# Patient Record
Sex: Female | Born: 1975 | Race: White | Hispanic: No | Marital: Married | Smoking: Former smoker
Health system: Southern US, Community
[De-identification: ages and names within clinical notes are randomized; demographics above are authoritative.]

## PROBLEM LIST (undated history)

## (undated) DIAGNOSIS — I10 Essential (primary) hypertension: Secondary | ICD-10-CM

## (undated) DIAGNOSIS — Z6791 Unspecified blood type, Rh negative: Secondary | ICD-10-CM

## (undated) DIAGNOSIS — O26899 Other specified pregnancy related conditions, unspecified trimester: Secondary | ICD-10-CM

## (undated) DIAGNOSIS — E119 Type 2 diabetes mellitus without complications: Secondary | ICD-10-CM

## (undated) HISTORY — DX: Other specified pregnancy related conditions, unspecified trimester: O26.899

## (undated) HISTORY — PX: CHOLECYSTECTOMY: SHX55

## (undated) HISTORY — DX: Unspecified blood type, rh negative: Z67.91

## (undated) HISTORY — DX: Type 2 diabetes mellitus without complications: E11.9

---
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
## 2012-05-31 ENCOUNTER — Encounter (HOSPITAL_COMMUNITY): Payer: Self-pay | Admitting: *Deleted

## 2012-05-31 ENCOUNTER — Emergency Department (HOSPITAL_COMMUNITY)
Admission: EM | Admit: 2012-05-31 | Discharge: 2012-05-31 | Disposition: A | Discharge status: Home / Self Care | Payer: No Typology Code available for payment source | Attending: Emergency Medicine | Admitting: Emergency Medicine

## 2012-05-31 DIAGNOSIS — Z041 Encounter for examination and observation following transport accident: Secondary | ICD-10-CM

## 2012-05-31 DIAGNOSIS — Z043 Encounter for examination and observation following other accident: Secondary | ICD-10-CM | POA: Insufficient documentation

## 2012-05-31 MED ORDER — IBUPROFEN 800 MG PO TABS
800.0000 mg | ORAL_TABLET | Freq: Once | ORAL | Status: AC
  Administered 2012-05-31: 800 mg via ORAL
  Filled 2012-05-31: qty 1
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
## 2012-05-31 NOTE — ED Notes (Signed)
Patient not in triage or triage waiting.  Patient called multiple times.

## 2012-05-31 NOTE — ED Provider Notes (Signed)
Medical screening examination/treatment/procedure(s) were performed by non-physician practitioner and as supervising physician I was immediately available for consultation/collaboration.   Loren Racer, MD 05/31/12 [phone]
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
## 2017-12-13 ENCOUNTER — Inpatient Hospital Stay: Admit: 2017-12-13 | Payer: Self-pay

## 2019-04-10 ENCOUNTER — Emergency Department (HOSPITAL_COMMUNITY)
Admission: EM | Admit: 2019-04-10 | Discharge: 2019-04-10 | Disposition: A | Discharge status: Home / Self Care | Payer: Medicaid Other | Attending: Emergency Medicine | Admitting: Emergency Medicine

## 2019-04-10 ENCOUNTER — Encounter (HOSPITAL_COMMUNITY): Payer: Self-pay | Admitting: *Deleted

## 2019-04-10 ENCOUNTER — Emergency Department (HOSPITAL_COMMUNITY): Payer: Medicaid Other

## 2019-04-10 ENCOUNTER — Other Ambulatory Visit: Payer: Self-pay

## 2019-04-10 DIAGNOSIS — R42 Dizziness and giddiness: Secondary | ICD-10-CM | POA: Diagnosis not present

## 2019-04-10 DIAGNOSIS — R079 Chest pain, unspecified: Secondary | ICD-10-CM | POA: Diagnosis not present

## 2019-04-10 DIAGNOSIS — F17210 Nicotine dependence, cigarettes, uncomplicated: Secondary | ICD-10-CM | POA: Insufficient documentation

## 2019-04-10 DIAGNOSIS — R531 Weakness: Secondary | ICD-10-CM | POA: Insufficient documentation

## 2019-04-10 DIAGNOSIS — R51 Headache: Secondary | ICD-10-CM | POA: Diagnosis not present

## 2019-04-10 LAB — CBC WITH DIFFERENTIAL/PLATELET
Abs Immature Granulocytes: 0.03 10*3/uL (ref 0.00–0.07)
Basophils Absolute: 0.1 10*3/uL (ref 0.0–0.1)
Basophils Relative: 1 %
Eosinophils Absolute: 0.1 10*3/uL (ref 0.0–0.5)
Eosinophils Relative: 1 %
HCT: 41.1 % (ref 36.0–46.0)
Hemoglobin: 13.3 g/dL (ref 12.0–15.0)
Immature Granulocytes: 0 %
Lymphocytes Relative: 39 %
Lymphs Abs: 4 10*3/uL (ref 0.7–4.0)
MCH: 28.6 pg (ref 26.0–34.0)
MCHC: 32.4 g/dL (ref 30.0–36.0)
MCV: 88.4 fL (ref 80.0–100.0)
Monocytes Absolute: 0.7 10*3/uL (ref 0.1–1.0)
Monocytes Relative: 7 %
Neutro Abs: 5.6 10*3/uL (ref 1.7–7.7)
Neutrophils Relative %: 52 %
Platelets: 248 10*3/uL (ref 150–400)
RBC: 4.65 MIL/uL (ref 3.87–5.11)
RDW: 14.1 % (ref 11.5–15.5)
WBC: 10.5 10*3/uL (ref 4.0–10.5)
nRBC: 0 % (ref 0.0–0.2)

## 2019-04-10 LAB — COMPREHENSIVE METABOLIC PANEL
ALT: 23 U/L (ref 0–44)
AST: 22 U/L (ref 15–41)
Albumin: 3.8 g/dL (ref 3.5–5.0)
Alkaline Phosphatase: 82 U/L (ref 38–126)
Anion gap: 9 (ref 5–15)
BUN: 15 mg/dL (ref 6–20)
CO2: 20 mmol/L — ABNORMAL LOW (ref 22–32)
Calcium: 8.9 mg/dL (ref 8.9–10.3)
Chloride: 107 mmol/L (ref 98–111)
Creatinine, Ser: 0.8 mg/dL (ref 0.44–1.00)
GFR calc Af Amer: >60 mL/min (ref >60)
GFR calc non Af Amer: >60 mL/min (ref >60)
Glucose, Bld: 91 mg/dL (ref 70–99)
Potassium: 3.8 mmol/L (ref 3.5–5.1)
Sodium: 136 mmol/L (ref 135–145)
Total Bilirubin: 0.4 mg/dL (ref 0.3–1.2)
Total Protein: 7.7 g/dL (ref 6.5–8.1)

## 2019-04-10 LAB — URINALYSIS, ROUTINE W REFLEX MICROSCOPIC
Bilirubin Urine: NEGATIVE
Glucose, UA: NEGATIVE mg/dL
Ketones, ur: NEGATIVE mg/dL
Leukocytes,Ua: NEGATIVE
Nitrite: NEGATIVE
Protein, ur: NEGATIVE mg/dL
Specific Gravity, Urine: 1.02 (ref 1.005–1.030)
pH: 5 (ref 5.0–8.0)

## 2019-04-10 LAB — POC URINE PREG, ED: Preg Test, Ur: NEGATIVE

## 2019-04-10 LAB — RAPID URINE DRUG SCREEN, HOSP PERFORMED
Amphetamines: NOT DETECTED
Barbiturates: NOT DETECTED
Benzodiazepines: NOT DETECTED
Cocaine: NOT DETECTED
Opiates: NOT DETECTED
Tetrahydrocannabinol: POSITIVE — AB

## 2019-04-10 LAB — ETHANOL: Alcohol, Ethyl (B): <10 mg/dL (ref <10)

## 2019-04-10 LAB — TROPONIN I: Troponin I: <0.03 ng/mL (ref <0.03)

## 2019-04-10 LAB — PROTIME-INR
INR: 1 (ref 0.8–1.2)
Prothrombin Time: 13.3 seconds (ref 11.4–15.2)

## 2019-04-10 LAB — APTT: aPTT: 24 seconds (ref 24–36)

## 2019-04-10 NOTE — ED Triage Notes (Signed)
Patient reports dizziness for one day, worsening today with numbness to "entire body" for 30 minutes, family reporting slurred speech.

## 2019-04-10 NOTE — ED Notes (Signed)
Pt eating [employer] pizza

## 2019-04-10 NOTE — ED Provider Notes (Signed)
[facility]GENCY DEPARTMENT Provider Note   CSN: [id number] Arrival date & time: 04/10/19  1219    History   Chief Complaint Chief Complaint  Patient presents with  . Dizziness    HPI Melissa BeachShenequa Ramsey is a 43 y.o. female with no significant past medical history presenting with a 24 hour history of intermittent dizziness.  She describes a room spinning sensation lasting 30 minutes or less, both occurring yesterday morning than again this morning while standing outside smoking a cigarette.  This mornings episode was more severe with description of lasting longer, accompanied by vision loss transiently and inability to move.  She describes being unable to move, she felt "frozen in place" and her son had to help her sit from a standing position. Once better, she went in to take a bath at which time her husband noted her speech was slurred which she states lasted several minutes.  She reports chronic constant headaches which she blames on dehydration (states she never drinks water, only drinks soda's).  Her headache is worse today however, posterior in location.  In addition to the slurred speech, she was also tearful/crying while in the bath, unsure why but noted she only had tears coming from her right eye. She currently denies any of the above symptoms, but feels weak and fatigued, can barely lift her arms off the bed and feels like there is a heavy blanket on her.  Of note, she ambulated into the department.   Secondly, she endorses chest pain which she has often, current pain present for the past several days, left sided, sharp and constant. She denies sob, pain has no triggers, reports has had this sx intermittently for years, never been evaluated, states she just blames it on being "fat".  She has had no treatment prior to arrival.      The history is provided by the patient.    No past medical history on file.  There are no active problems to display for this patient.   Past  Surgical History:  Procedure Laterality Date  . CHOLECYSTECTOMY       OB History   No obstetric history on file.      Home Medications    Prior to Admission medications   Not on File    Family History No family history on file.  Social History Social History   Tobacco Use  . Smoking status: Current Every Day Smoker  Substance Use Topics  . Alcohol use: Not Currently  . Drug use: Not Currently     Allergies   Patient has no known allergies.   Review of Systems Review of Systems  Constitutional: Negative for chills and fever.  HENT: Negative for congestion and sore throat.   Eyes: Positive for visual disturbance.  Respiratory: Negative for chest tightness and shortness of breath.   Cardiovascular: Positive for chest pain.  Gastrointestinal: Negative for abdominal pain, nausea and vomiting.  Genitourinary: Negative.   Musculoskeletal: Negative for arthralgias, joint swelling, neck pain and neck stiffness.  Skin: Negative.  Negative for rash and wound.  Neurological: Positive for dizziness, speech difficulty, weakness and headaches. Negative for light-headedness and numbness.  Psychiatric/Behavioral: Negative.   All other systems reviewed and are negative.    Physical Exam Updated Vital Signs BP 108/84   Pulse 82   Resp 20   Ht 4\' 11"  (1.499 m)   Wt 72.6 kg   LMP 04/04/2019   SpO2 99%   BMI 32.32 kg/m   Physical Exam  Vitals signs and nursing note reviewed.  Constitutional:      General: She is not in acute distress.    Appearance: She is well-developed.  HENT:     Head: Normocephalic and atraumatic.     Mouth/Throat:     Mouth: Mucous membranes are moist.  Eyes:     General: No visual field deficit.    Extraocular Movements: Extraocular movements intact.     Conjunctiva/sclera: Conjunctivae normal.     Pupils: Pupils are equal, round, and reactive to light.  Neck:     Musculoskeletal: Normal range of motion. No neck rigidity.   Cardiovascular:     Rate and Rhythm: Normal rate and regular rhythm.     Heart sounds: Normal heart sounds.  Pulmonary:     Effort: Pulmonary effort is normal.     Breath sounds: Normal breath sounds. No wheezing.  Abdominal:     General: Bowel sounds are normal.     Palpations: Abdomen is soft.     Tenderness: There is no abdominal tenderness.  Musculoskeletal: Normal range of motion.        General: No tenderness or deformity.  Lymphadenopathy:     Cervical: No cervical adenopathy.  Skin:    General: Skin is warm and dry.     Capillary Refill: Capillary refill takes less than 2 seconds.  Neurological:     General: No focal deficit present.     Mental Status: She is alert and oriented to person, place, and time.     Cranial Nerves: Cranial nerves are intact. No cranial nerve deficit, dysarthria or facial asymmetry.     Motor: Weakness present. No tremor or pronator drift.     Coordination: Finger-Nose-Finger Test and Heel to [place] Test normal.     Comments: Pt moves all 4 extremities but poor effort.  Equal grip strength.        ED Treatments / Results  Labs (all labs ordered are listed, but only abnormal results are displayed) Labs Reviewed  COMPREHENSIVE METABOLIC PANEL - Abnormal; Notable for the following components:      Result Value   CO2 20 (*)    All other components within normal limits  RAPID URINE DRUG SCREEN, HOSP PERFORMED - Abnormal; Notable for the following components:   Tetrahydrocannabinol POSITIVE (*)    All other components within normal limits  URINALYSIS, ROUTINE W REFLEX MICROSCOPIC - Abnormal; Notable for the following components:   APPearance HAZY (*)    Hgb urine dipstick SMALL (*)    Bacteria, UA RARE (*)    All other components within normal limits  ETHANOL  PROTIME-INR  APTT  CBC WITH DIFFERENTIAL/PLATELET  TROPONIN I  POC URINE PREG, ED    EKG EKG Interpretation  Date/Time:  Monday April 10 2019 12:41:49 EDT Ventricular Rate:  93  PR Interval:    QRS Duration: 90 QT Interval:  369 QTC Calculation: 459 R Axis:   30 Text Interpretation:  Sinus rhythm Baseline wander in lead(s) II III aVR aVL aVF No old tracing to compare Confirmed by Jacalyn Lefevre [phone]) on 04/10/2019 1:16:42 PM   Radiology Ct Head Wo Contrast  Result Date: 04/10/2019 CLINICAL DATA:  Dizziness and headache. EXAM: CT HEAD WITHOUT CONTRAST TECHNIQUE: Contiguous axial images were obtained from the base of the skull through the vertex without intravenous contrast. COMPARISON:  None. FINDINGS: Brain: No evidence of acute infarction, hemorrhage, hydrocephalus, extra-axial collection or mass lesion/mass effect. Vascular: No hyperdense vessel or unexpected calcification. Skull: Normal. Negative  for fracture or focal lesion. Sinuses/Orbits: No acute finding. Other: None. IMPRESSION: Normal head CT. Electronically Signed   By: Irish Lack M.D.   On: 04/10/2019 14:45   Dg Chest Portable 1 View  Result Date: 04/10/2019 CLINICAL DATA:  Dizziness.  Chest pain.  Smoking history. EXAM: PORTABLE CHEST 1 VIEW COMPARISON:  10/08/2018 FINDINGS: The heart size and mediastinal contours are within normal limits. Both lungs are clear. The visualized skeletal structures are unremarkable. IMPRESSION: No active disease. Electronically Signed   By: Paulina Fusi M.D.   On: 04/10/2019 13:46    Procedures Procedures (including critical care time)  Medications Ordered in ED Medications - No data to display   Initial Impression / Assessment and Plan / ED Course  I have reviewed the triage vital signs and the nursing notes.  Pertinent labs & imaging results that were available during my care of the patient were reviewed by me and considered in my medical decision making (see chart for details).        Labs reviewed and discussed with pt with normal findings except positive for [facility] which pt denies.  She was ambulatory in the dept. In fact was walking down the hallway to  get the pizza she had ordered for delivery just prior to dc home.  Advised to avoid smoking marijuana.  Advised f/u with primary care, referrals given.   Final Clinical Impressions(s) / ED Diagnoses   Final diagnoses:  Lightheadedness  Weakness    ED Discharge Orders    None       Victoriano Lain 04/10/19 1508    Jacalyn Lefevre, MD 04/11/19 [phone]

## 2019-04-10 NOTE — Discharge Instructions (Addendum)
Your labs, ekg and imaging studies today are stable with no evidence of stroke or other reason for today's symptoms.  You have been smoking marijuana however as evidenced by your labs.  This can be the source of your symptoms.  See the options above for obtaining primary care. In the interim, return here for any worsened or new symptoms.

## 2019-05-29 ENCOUNTER — Telehealth: Payer: Self-pay | Admitting: *Deleted

## 2019-05-29 NOTE — Telephone Encounter (Signed)
Call can't be completed @ 4:21 pm and pt don't have MyChart. [place]

## 2019-05-30 ENCOUNTER — Encounter: Payer: Self-pay | Admitting: Adult Health

## 2019-05-30 ENCOUNTER — Other Ambulatory Visit: Payer: Self-pay

## 2019-05-30 ENCOUNTER — Ambulatory Visit (INDEPENDENT_AMBULATORY_CARE_PROVIDER_SITE_OTHER): Payer: Medicaid Other | Admitting: Adult Health

## 2019-05-30 VITALS — Ht 59.0 in | Wt 164.0 lb

## 2019-05-30 DIAGNOSIS — O09211 Supervision of pregnancy with history of pre-term labor, first trimester: Secondary | ICD-10-CM | POA: Diagnosis not present

## 2019-05-30 DIAGNOSIS — Z3A01 Less than 8 weeks gestation of pregnancy: Secondary | ICD-10-CM | POA: Diagnosis not present

## 2019-05-30 DIAGNOSIS — Z3201 Encounter for pregnancy test, result positive: Secondary | ICD-10-CM

## 2019-05-30 DIAGNOSIS — Z8632 Personal history of gestational diabetes: Secondary | ICD-10-CM

## 2019-05-30 DIAGNOSIS — O3680X0 Pregnancy with inconclusive fetal viability, not applicable or unspecified: Secondary | ICD-10-CM | POA: Diagnosis not present

## 2019-05-30 DIAGNOSIS — O09291 Supervision of pregnancy with other poor reproductive or obstetric history, first trimester: Secondary | ICD-10-CM | POA: Diagnosis not present

## 2019-05-30 DIAGNOSIS — O99331 Smoking (tobacco) complicating pregnancy, first trimester: Secondary | ICD-10-CM

## 2019-05-30 DIAGNOSIS — O09521 Supervision of elderly multigravida, first trimester: Secondary | ICD-10-CM | POA: Insufficient documentation

## 2019-05-30 DIAGNOSIS — F17200 Nicotine dependence, unspecified, uncomplicated: Secondary | ICD-10-CM | POA: Insufficient documentation

## 2019-05-30 DIAGNOSIS — O09299 Supervision of pregnancy with other poor reproductive or obstetric history, unspecified trimester: Secondary | ICD-10-CM | POA: Insufficient documentation

## 2019-05-30 DIAGNOSIS — O09891 Supervision of other high risk pregnancies, first trimester: Secondary | ICD-10-CM

## 2019-05-30 DIAGNOSIS — Z720 Tobacco use: Secondary | ICD-10-CM | POA: Insufficient documentation

## 2019-05-30 HISTORY — DX: Supervision of pregnancy with other poor reproductive or obstetric history, first trimester: O09.291

## 2019-05-30 MED ORDER — PROMETHAZINE HCL 25 MG PO TABS: 25.0000 mg | ORAL_TABLET | Freq: Four times a day (QID) | ORAL | 1 refills | Status: DC | PRN

## 2019-05-30 MED ORDER — PRENATAL PLUS 27-1 MG PO TABS: 1.0000 | ORAL_TABLET | Freq: Every day | ORAL | 12 refills | Status: DC

## 2019-05-30 NOTE — Progress Notes (Addendum)
Patient ID: Melissa BeachShenequa Mckiernan, female   DOB: 10/23/1976, 43 y.o.   MRN: [id number]   TELEHEALTH VIRTUAL GYNECOLOGY VISIT ENCOUNTER NOTE  I connected with Melissa Ramsey on 05/30/19 at 11:00 AM EDT by telephone at home and verified that I am speaking with the correct person using two identifiers.   I discussed the limitations, risks, security and privacy concerns of performing an evaluation and management service by telephone and the availability of in person appointments. I also discussed with the patient that there may be a patient responsible charge related to this service. The patient expressed understanding and agreed to proceed.   History:  Melissa Ramsey is a 43 y.o. [phone] [id number], white  Female,married, being evaluated today for missing a period and had 2+HPTs, about about 4+6 weeks by LMP with EDD 01/31/20, has had nausea and used CBD oil, told to stop. She had miscarriage in 2018 and has history of gestational diabetes, pre eclampsia, and preterm delivery and had  C-section for breech. And is Rh negative, she says.   She denies any abnormal vaginal discharge, bleeding, pelvic pain or other concerns.       Past Medical History:  Diagnosis Date  . Diabetes mellitus without complication (HCC)    gestational diabetes   . Hypertension    pre eclampsia    Past Surgical History:  Procedure Laterality Date  . CESAREAN SECTION    . CHOLECYSTECTOMY     The following portions of the patient's history were reviewed and updated as appropriate: allergies, current medications, past family history, past medical history, past social history, past surgical history and problem list.   [facility]  Review of Systems:  Pertinent items noted in HPI and remainder of comprehensive ROS otherwise negative.  Physical Exam:   General:  Alert, oriented and cooperative.   Mental Status: Normal mood and affect perceived. Normal judgment and thought content.  Physical exam deferred due  to nature of the encounter Ht 4\' 11"  (1.499 m)   Wt 164 lb (74.4 kg)   LMP 04/26/2019   BMI 33.12 kg/m per pt. Fall risk is low. PHQ 2 score 0. Decrease smoking, stop CBD oil, will Rx phenergan and eat often.  Labs and Imaging No results found for this or any previous visit (from the past 336 hour(s)). No results found.    Assessment and Plan:     1. Encounter to determine fetal viability of pregnancy, single or unspecified fetus Return in about 3 week for dating US  - US OB Comp Less 14 Wks; Future  2. Multigravida of advanced maternal age in first trimester   3. Less than [redacted] weeks gestation of pregnancy Eat often Meds ordered this encounter  Medications  . prenatal vitamin w/FE, FA (PRENATAL 1 + 1) 27-1 MG TABS tablet    Sig: Take 1 tablet by mouth daily at 12 noon.    Dispense:  30 each    Refill:  12    Order Specific Question:   Supervising Provider    Answer:   Despina HiddenEURE, LUTHER H [[id number]]  . promethazine (PHENERGAN) 25 MG tablet    Sig: Take 1 tablet (25 mg total) by mouth every 6 (six) hours as needed for nausea or vomiting.    Dispense:  30 tablet    Refill:  1    Order Specific Question:   Supervising Provider    Answer:   Despina HiddenEURE, LUTHER H [[id number]]    4. Positive pregnancy test 2+ HPTs  5. History of preterm delivery, currently pregnant in first trimester   6. History of gestational diabetes in prior pregnancy, currently pregnant  7. History of pre-eclampsia in prior pregnancy, currently pregnant in first trimester  8. Smoker -starting cutting down on cigarettes        I discussed the assessment and treatment plan with the patient. The patient was provided an opportunity to ask questions and all were answered. The patient agreed with the plan and demonstrated an understanding of the instructions.   The patient was advised to call back or seek an in-person evaluation/go to the ED if the symptoms worsen or if the condition fails to improve as anticipated.  I  provided 11 minutes of non-face-to-face time during this encounter.   Derrek Monaco, NP [facility], [place]

## 2019-06-14 ENCOUNTER — Telehealth: Payer: Self-pay | Admitting: *Deleted

## 2019-06-14 NOTE — Telephone Encounter (Signed)
Patient called stating she is having light bleeding after intercourse with her husband.  No cramping at this time.  Informed patient that bleeding after sex is common and should resolve on it's own.  If she develops heavier bleeding or cramping, to call our office or go to the hospital.  Advised no sex for 7 days until after the bleeding stops.  PT verbalized understanding.

## 2019-06-19 ENCOUNTER — Telehealth: Payer: Self-pay | Admitting: *Deleted

## 2019-06-19 NOTE — Telephone Encounter (Signed)
I called patient no answer left voicemail of covid restrictions and appt info

## 2019-06-20 ENCOUNTER — Other Ambulatory Visit: Payer: Self-pay

## 2019-06-20 ENCOUNTER — Ambulatory Visit (INDEPENDENT_AMBULATORY_CARE_PROVIDER_SITE_OTHER): Payer: Medicaid Other

## 2019-06-20 DIAGNOSIS — Z3A01 Less than 8 weeks gestation of pregnancy: Secondary | ICD-10-CM

## 2019-06-20 DIAGNOSIS — O3680X0 Pregnancy with inconclusive fetal viability, not applicable or unspecified: Secondary | ICD-10-CM

## 2019-06-20 NOTE — Progress Notes (Signed)
6+3 wks,single IUP w/ys,positive fht 86 bpm,normal ovaries bilat,crl 6.79 mm,pt will come back for f/u ultrasound to ck FHR per Anderson Malta

## 2019-07-11 ENCOUNTER — Telehealth: Payer: Self-pay | Admitting: Obstetrics and Gynecology

## 2019-07-11 ENCOUNTER — Other Ambulatory Visit: Payer: Self-pay | Admitting: Adult Health

## 2019-07-11 DIAGNOSIS — O3680X0 Pregnancy with inconclusive fetal viability, not applicable or unspecified: Secondary | ICD-10-CM

## 2019-07-11 NOTE — Telephone Encounter (Signed)

## 2019-07-12 ENCOUNTER — Other Ambulatory Visit: Payer: Self-pay | Admitting: Adult Health

## 2019-07-12 ENCOUNTER — Other Ambulatory Visit: Payer: Self-pay

## 2019-07-12 ENCOUNTER — Ambulatory Visit (INDEPENDENT_AMBULATORY_CARE_PROVIDER_SITE_OTHER): Payer: Medicaid Other | Admitting: Obstetrics and Gynecology

## 2019-07-12 ENCOUNTER — Ambulatory Visit (INDEPENDENT_AMBULATORY_CARE_PROVIDER_SITE_OTHER): Payer: Medicaid Other

## 2019-07-12 DIAGNOSIS — O3680X0 Pregnancy with inconclusive fetal viability, not applicable or unspecified: Secondary | ICD-10-CM | POA: Diagnosis not present

## 2019-07-12 DIAGNOSIS — Z3A01 Less than 8 weeks gestation of pregnancy: Secondary | ICD-10-CM

## 2019-07-12 DIAGNOSIS — Z3A09 9 weeks gestation of pregnancy: Secondary | ICD-10-CM | POA: Diagnosis not present

## 2019-07-12 DIAGNOSIS — O021 Missed abortion: Secondary | ICD-10-CM | POA: Diagnosis not present

## 2019-07-12 NOTE — Progress Notes (Signed)
F/U US 6+5 wks single IUP,no fht,no signification growth from prior ultrasound,GS 41.3 mm=9+4 wks,normal ovaries bilat,Dr Glo Herring review images with pt

## 2019-07-12 NOTE — Progress Notes (Signed)
   [facility] Visit  @DATE @            Patient name: Melissa Ramsey MRN [id number]  Date of birth: 1976-01-27  CC & HPI:  Melissa Ramsey is a 43 y.o. female presenting today for follow-up ultrasound for fetal viability.  She had ultrasound with a 7 mm fetal pole on 7 July that has not shown any fetal activity this is her first follow-up visit she is having no bleeding Today's ultrasound shows an 8.0 mm fetal pole with no fetal cardiac activity.  The maternal heart rate pulsations results in some rocking motion of the fetal pole but no fetal cardiac activity is present I explained to the patient the difference and shown her how large the baby should be and she understands the inevitability of pregnancy loss series.  She desires to wait until she begins to bleed to use the Cytotec pills to expect the miscarriage ROS:  ROS [id number] she had a miscarriage 2 years ago   Pertinent History Reviewed:   Reviewed: Significant for spontaneously expelled pregnancy 2 years ago Medical         Past Medical History:  Diagnosis Date  . Diabetes mellitus without complication (HCC)    gestational diabetes   . Hypertension    pre eclampsia   . Rh negative state in antepartum period                               Surgical Hx:    Past Surgical History:  Procedure Laterality Date  . CESAREAN SECTION    . CHOLECYSTECTOMY     Medications: Reviewed & Updated - see associated section                       Current Outpatient Medications:  .  prenatal vitamin w/FE, FA (PRENATAL 1 + 1) 27-1 MG TABS tablet, Take 1 tablet by mouth daily at 12 noon., Disp: 30 each, Rfl: 12 .  promethazine (PHENERGAN) 25 MG tablet, Take 1 tablet (25 mg total) by mouth every 6 (six) hours as needed for nausea or vomiting., Disp: 30 tablet, Rfl: 1   Social History: Reviewed -  reports that she has been smoking cigarettes. She has never used smokeless tobacco.  Objective Findings:  Vitals: Last menstrual period  04/26/2019.  PHYSICAL EXAMINATION General appearance - alert, well appearing, and in no distress and anxious Mental status - alert, oriented to person, place, and time, normal mood, behavior, speech, dress, motor activity, and thought processes Chest - clear to auscultation, no wheezes, rales or rhonchi, symmetric air entry Heart - normal rate and regular rhythm Abdomen - not examined Breasts -  Skin - normal coloration and turgor, no rashes, no suspicious skin lesions noted  PELVIC Not done, declined by patient after offered  Assessment & Plan:   A:  1. Inevitable AB  P:  1. Will send in prescription for Cytotec 800 mcg to be placed per vagina when she begins to have spotting.   2. Follow-up 3 weeks

## 2019-08-01 ENCOUNTER — Telehealth: Payer: Self-pay | Admitting: Obstetrics and Gynecology

## 2019-08-01 NOTE — Telephone Encounter (Signed)
Called patient and left message informing her that we are not allowing any visitors or children to come to visit with her at this time and we are requiring a mask to be worn during the visit. Asked if she has had any exposure to anyone suspected or confirmed of having COVID-19 or if she is experiencing any of the following: fever, cough, sob, muscle pain, severe headache, sore throat, diarrhea, loss of taste or smell or ear, nose or throat discomfort to call and reschedule.   °

## 2019-08-02 ENCOUNTER — Ambulatory Visit (INDEPENDENT_AMBULATORY_CARE_PROVIDER_SITE_OTHER): Payer: Medicaid Other | Admitting: Obstetrics and Gynecology

## 2019-08-02 ENCOUNTER — Encounter: Payer: Self-pay | Admitting: Obstetrics and Gynecology

## 2019-08-02 ENCOUNTER — Other Ambulatory Visit: Payer: Self-pay

## 2019-08-02 VITALS — BP 115/77 | HR 95 | Ht 59.0 in | Wt 164.0 lb

## 2019-08-02 DIAGNOSIS — O021 Missed abortion: Secondary | ICD-10-CM

## 2019-08-02 DIAGNOSIS — Z3A01 Less than 8 weeks gestation of pregnancy: Secondary | ICD-10-CM | POA: Diagnosis not present

## 2019-08-02 MED ORDER — MISOPROSTOL 200 MCG PO TABS: 800.0000 ug | ORAL_TABLET | Freq: Once | ORAL | 1 refills | Status: DC

## 2019-08-02 NOTE — Progress Notes (Signed)
Patient ID: Melissa Ramsey, female   DOB: 1976/10/13, 43 y.o.   MRN: [id number]    [facility] Visit  @DATE @            Patient name: Melissa Ramsey MRN [id number]  Date of birth: 1976/11/08  CC & HPI:  Melissa Ramsey is a 43 y.o. female presenting today for Missed AB pt . Started spotting and small clots when voiding this past Sunday 07/30/2019 hasn't taken cytotec. Wants to move foreward in expelling the miscarriage.and take cytotec  ROS:  ROS +vaginal spotting  Pertinent History Reviewed:   Reviewed:  Medical         Past Medical History:  Diagnosis Date  . Diabetes mellitus without complication (HCC)    gestational diabetes   . Hypertension    pre eclampsia   . Rh negative state in antepartum period                               Surgical Hx:    Past Surgical History:  Procedure Laterality Date  . CESAREAN SECTION    . CHOLECYSTECTOMY     Medications: Reviewed & Updated - see associated section                       Current Outpatient Medications:  .  prenatal vitamin w/FE, FA (PRENATAL 1 + 1) 27-1 MG TABS tablet, Take 1 tablet by mouth daily at 12 noon. (Patient not taking: Reported on 08/02/2019), Disp: 30 each, Rfl: 12 .  promethazine (PHENERGAN) 25 MG tablet, Take 1 tablet (25 mg total) by mouth every 6 (six) hours as needed for nausea or vomiting. (Patient not taking: Reported on 08/02/2019), Disp: 30 tablet, Rfl: 1   Social History: Reviewed -  reports that she has been smoking cigarettes. She has never used smokeless tobacco.  Objective Findings:  Vitals: Blood pressure 115/77, pulse 95, height 4\' 11"  (1.499 m), weight 164 lb (74.4 kg), last menstrual period 04/26/2019.  PHYSICAL EXAMINATION General appearance - alert, well appearing, and in no distress Mental status - alert, oriented to person, place, and time, normal mood, behavior, speech, dress, motor activity, and thought processes, affect appropriate to mood  PELVIC DEFERRED DISCUSSION  ONLY  Assessment & Plan:   A:  1.  Missed AB desires to use cytotec now.  P:  1.  Rx cytotec 2. F/u prn or in 2 weeks if positive pregnancy test    By signing my name below, I, Samul Dada, attest that this documentation has been prepared under the direction and in the presence of Jonnie Kind, MD. Electronically Signed: [place]. 08/02/19. 3:04 PM.  I personally performed the services described in this documentation, which was SCRIBED in my presence. The recorded information has been reviewed and considered accurate. It has been edited as necessary during review. Jonnie Kind, MD

## 2019-08-29 DIAGNOSIS — F4323 Adjustment disorder with mixed anxiety and depressed mood: Secondary | ICD-10-CM | POA: Diagnosis not present

## 2019-09-05 DIAGNOSIS — F4323 Adjustment disorder with mixed anxiety and depressed mood: Secondary | ICD-10-CM | POA: Diagnosis not present

## 2019-09-08 DIAGNOSIS — F4323 Adjustment disorder with mixed anxiety and depressed mood: Secondary | ICD-10-CM | POA: Diagnosis not present

## 2019-09-12 DIAGNOSIS — F4323 Adjustment disorder with mixed anxiety and depressed mood: Secondary | ICD-10-CM | POA: Diagnosis not present

## 2019-09-19 DIAGNOSIS — F4323 Adjustment disorder with mixed anxiety and depressed mood: Secondary | ICD-10-CM | POA: Diagnosis not present

## 2019-09-26 DIAGNOSIS — F4323 Adjustment disorder with mixed anxiety and depressed mood: Secondary | ICD-10-CM | POA: Diagnosis not present

## 2019-10-03 DIAGNOSIS — F4323 Adjustment disorder with mixed anxiety and depressed mood: Secondary | ICD-10-CM | POA: Diagnosis not present

## 2019-10-11 DIAGNOSIS — F4323 Adjustment disorder with mixed anxiety and depressed mood: Secondary | ICD-10-CM | POA: Diagnosis not present

## 2019-10-19 DIAGNOSIS — F4323 Adjustment disorder with mixed anxiety and depressed mood: Secondary | ICD-10-CM | POA: Diagnosis not present

## 2019-10-24 DIAGNOSIS — F4323 Adjustment disorder with mixed anxiety and depressed mood: Secondary | ICD-10-CM | POA: Diagnosis not present

## 2019-10-31 DIAGNOSIS — F4323 Adjustment disorder with mixed anxiety and depressed mood: Secondary | ICD-10-CM | POA: Diagnosis not present

## 2019-11-07 DIAGNOSIS — F4323 Adjustment disorder with mixed anxiety and depressed mood: Secondary | ICD-10-CM | POA: Diagnosis not present

## 2019-11-14 DIAGNOSIS — F4323 Adjustment disorder with mixed anxiety and depressed mood: Secondary | ICD-10-CM | POA: Diagnosis not present

## 2019-11-21 DIAGNOSIS — F4323 Adjustment disorder with mixed anxiety and depressed mood: Secondary | ICD-10-CM | POA: Diagnosis not present

## 2019-11-28 DIAGNOSIS — F4323 Adjustment disorder with mixed anxiety and depressed mood: Secondary | ICD-10-CM | POA: Diagnosis not present

## 2019-12-11 DIAGNOSIS — F4323 Adjustment disorder with mixed anxiety and depressed mood: Secondary | ICD-10-CM | POA: Diagnosis not present

## 2019-12-19 DIAGNOSIS — F4323 Adjustment disorder with mixed anxiety and depressed mood: Secondary | ICD-10-CM | POA: Diagnosis not present

## 2019-12-21 DIAGNOSIS — F4323 Adjustment disorder with mixed anxiety and depressed mood: Secondary | ICD-10-CM | POA: Diagnosis not present

## 2019-12-25 DIAGNOSIS — F4323 Adjustment disorder with mixed anxiety and depressed mood: Secondary | ICD-10-CM | POA: Diagnosis not present

## 2020-01-02 DIAGNOSIS — F4323 Adjustment disorder with mixed anxiety and depressed mood: Secondary | ICD-10-CM | POA: Diagnosis not present

## 2020-01-08 DIAGNOSIS — F4323 Adjustment disorder with mixed anxiety and depressed mood: Secondary | ICD-10-CM | POA: Diagnosis not present

## 2020-01-15 DIAGNOSIS — F4323 Adjustment disorder with mixed anxiety and depressed mood: Secondary | ICD-10-CM | POA: Diagnosis not present

## 2020-01-22 DIAGNOSIS — F4323 Adjustment disorder with mixed anxiety and depressed mood: Secondary | ICD-10-CM | POA: Diagnosis not present

## 2020-01-29 DIAGNOSIS — F4323 Adjustment disorder with mixed anxiety and depressed mood: Secondary | ICD-10-CM | POA: Diagnosis not present

## 2020-02-05 DIAGNOSIS — F4323 Adjustment disorder with mixed anxiety and depressed mood: Secondary | ICD-10-CM | POA: Diagnosis not present

## 2020-02-12 DIAGNOSIS — F4323 Adjustment disorder with mixed anxiety and depressed mood: Secondary | ICD-10-CM | POA: Diagnosis not present

## 2020-02-19 DIAGNOSIS — F4323 Adjustment disorder with mixed anxiety and depressed mood: Secondary | ICD-10-CM | POA: Diagnosis not present

## 2020-02-29 DIAGNOSIS — F4323 Adjustment disorder with mixed anxiety and depressed mood: Secondary | ICD-10-CM | POA: Diagnosis not present

## 2020-03-04 DIAGNOSIS — F4323 Adjustment disorder with mixed anxiety and depressed mood: Secondary | ICD-10-CM | POA: Diagnosis not present

## 2020-03-11 DIAGNOSIS — F4323 Adjustment disorder with mixed anxiety and depressed mood: Secondary | ICD-10-CM | POA: Diagnosis not present

## 2020-03-18 DIAGNOSIS — F4323 Adjustment disorder with mixed anxiety and depressed mood: Secondary | ICD-10-CM | POA: Diagnosis not present

## 2020-03-25 DIAGNOSIS — F4323 Adjustment disorder with mixed anxiety and depressed mood: Secondary | ICD-10-CM | POA: Diagnosis not present

## 2020-04-01 DIAGNOSIS — F4323 Adjustment disorder with mixed anxiety and depressed mood: Secondary | ICD-10-CM | POA: Diagnosis not present

## 2020-04-08 DIAGNOSIS — F4323 Adjustment disorder with mixed anxiety and depressed mood: Secondary | ICD-10-CM | POA: Diagnosis not present

## 2020-04-15 DIAGNOSIS — F4323 Adjustment disorder with mixed anxiety and depressed mood: Secondary | ICD-10-CM | POA: Diagnosis not present

## 2020-04-22 DIAGNOSIS — F4323 Adjustment disorder with mixed anxiety and depressed mood: Secondary | ICD-10-CM | POA: Diagnosis not present

## 2020-05-06 DIAGNOSIS — F4323 Adjustment disorder with mixed anxiety and depressed mood: Secondary | ICD-10-CM | POA: Diagnosis not present

## 2020-05-15 DIAGNOSIS — F4323 Adjustment disorder with mixed anxiety and depressed mood: Secondary | ICD-10-CM | POA: Diagnosis not present

## 2020-05-20 DIAGNOSIS — F4323 Adjustment disorder with mixed anxiety and depressed mood: Secondary | ICD-10-CM | POA: Diagnosis not present

## 2020-05-27 DIAGNOSIS — F4323 Adjustment disorder with mixed anxiety and depressed mood: Secondary | ICD-10-CM | POA: Diagnosis not present

## 2020-06-18 DIAGNOSIS — F4323 Adjustment disorder with mixed anxiety and depressed mood: Secondary | ICD-10-CM | POA: Diagnosis not present

## 2020-06-24 DIAGNOSIS — F4323 Adjustment disorder with mixed anxiety and depressed mood: Secondary | ICD-10-CM | POA: Diagnosis not present

## 2020-07-01 DIAGNOSIS — F4323 Adjustment disorder with mixed anxiety and depressed mood: Secondary | ICD-10-CM | POA: Diagnosis not present

## 2020-07-15 DIAGNOSIS — F4323 Adjustment disorder with mixed anxiety and depressed mood: Secondary | ICD-10-CM | POA: Diagnosis not present

## 2020-07-22 DIAGNOSIS — F4323 Adjustment disorder with mixed anxiety and depressed mood: Secondary | ICD-10-CM | POA: Diagnosis not present

## 2020-07-30 DIAGNOSIS — F4323 Adjustment disorder with mixed anxiety and depressed mood: Secondary | ICD-10-CM | POA: Diagnosis not present

## 2020-08-05 DIAGNOSIS — F4323 Adjustment disorder with mixed anxiety and depressed mood: Secondary | ICD-10-CM | POA: Diagnosis not present

## 2020-08-12 DIAGNOSIS — F4323 Adjustment disorder with mixed anxiety and depressed mood: Secondary | ICD-10-CM | POA: Diagnosis not present

## 2020-08-20 DIAGNOSIS — F4323 Adjustment disorder with mixed anxiety and depressed mood: Secondary | ICD-10-CM | POA: Diagnosis not present

## 2020-09-02 DIAGNOSIS — F4323 Adjustment disorder with mixed anxiety and depressed mood: Secondary | ICD-10-CM | POA: Diagnosis not present

## 2020-09-08 ENCOUNTER — Emergency Department (HOSPITAL_COMMUNITY)
Admission: EM | Admit: 2020-09-08 | Discharge: 2020-09-08 | Disposition: A | Discharge status: Home / Self Care | Payer: Medicaid Other | Attending: Emergency Medicine | Admitting: Emergency Medicine

## 2020-09-08 ENCOUNTER — Other Ambulatory Visit: Payer: Self-pay

## 2020-09-08 ENCOUNTER — Emergency Department (HOSPITAL_COMMUNITY): Payer: Medicaid Other

## 2020-09-08 ENCOUNTER — Encounter (HOSPITAL_COMMUNITY): Payer: Self-pay | Admitting: Emergency Medicine

## 2020-09-08 DIAGNOSIS — R519 Headache, unspecified: Secondary | ICD-10-CM | POA: Insufficient documentation

## 2020-09-08 DIAGNOSIS — R05 Cough: Secondary | ICD-10-CM | POA: Insufficient documentation

## 2020-09-08 DIAGNOSIS — M791 Myalgia, unspecified site: Secondary | ICD-10-CM | POA: Insufficient documentation

## 2020-09-08 DIAGNOSIS — R058 Other specified cough: Secondary | ICD-10-CM

## 2020-09-08 DIAGNOSIS — Z20822 Contact with and (suspected) exposure to covid-19: Secondary | ICD-10-CM | POA: Diagnosis not present

## 2020-09-08 DIAGNOSIS — R6883 Chills (without fever): Secondary | ICD-10-CM | POA: Insufficient documentation

## 2020-09-08 DIAGNOSIS — F17210 Nicotine dependence, cigarettes, uncomplicated: Secondary | ICD-10-CM | POA: Diagnosis not present

## 2020-09-08 DIAGNOSIS — R0602 Shortness of breath: Secondary | ICD-10-CM | POA: Diagnosis not present

## 2020-09-08 LAB — RESPIRATORY PANEL BY RT PCR (FLU A&B, COVID)
Influenza A by PCR: NEGATIVE
Influenza B by PCR: NEGATIVE
SARS Coronavirus 2 by RT PCR: NEGATIVE

## 2020-09-08 MED ORDER — ACETAMINOPHEN 325 MG PO TABS
650.0000 mg | ORAL_TABLET | Freq: Once | ORAL | Status: AC
  Administered 2020-09-08: 650 mg via ORAL
  Filled 2020-09-08: qty 2

## 2020-09-08 NOTE — ED Triage Notes (Signed)
Pt c/o SOB, cough, and headache x 1 week. Daughter tested positive for COVID on Friday.

## 2020-09-08 NOTE — Discharge Instructions (Signed)
You appear to have an upper respiratory infection (URI). An upper respiratory tract infection, or cold, is a viral infection of the air passages leading to the lungs. It is contagious and can be spread to others, especially during the first 3 or 4 days. It cannot be cured by antibiotics or other medicines. °RETURN IMMEDIATELY IF you develop shortness of breath, confusion or altered mental status, a new rash, become dizzy, faint, or poorly responsive, or are unable to be cared for at home. ° °

## 2020-09-08 NOTE — ED Provider Notes (Signed)
[facility] EMERGENCY DEPARTMENT Provider Note   CSN: [id number] Arrival date & time: 09/08/20  1656     History Chief Complaint  Patient presents with  . Covid Exposure    Melissa Ramsey is a 44 y.o. female.  Who presents with flulike symptoms.  Patient has 1 week of cough, body aches, chills, headache unsure if she has fever she does not have a thermometer, nausea and poor appetite.  Her daughter tested positive for coronavirus.  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  She denies shortness of breath.  HPI     Past Medical History:  Diagnosis Date  . Rh negative state in antepartum period     Patient Active Problem List   Diagnosis Date Noted  . Encounter to determine fetal viability of pregnancy 05/30/2019  . Multigravida of advanced maternal age in first trimester 05/30/2019  . Less than [redacted] weeks gestation of pregnancy 05/30/2019  . Positive pregnancy test 05/30/2019  . History of gestational diabetes in prior pregnancy, currently pregnant 05/30/2019  . History of preterm delivery, currently pregnant in first trimester 05/30/2019  . History of pre-eclampsia in prior pregnancy, currently pregnant in first trimester 05/30/2019  . Smoker 05/30/2019    Past Surgical History:  Procedure Laterality Date  . CESAREAN SECTION    . CHOLECYSTECTOMY       OB History    Gravida  8   Para  5   Term  3   Preterm  2   AB  2   Living  5     SAB  1   TAB      Ectopic      Multiple      Live Births  5           Family History  Problem Relation Age of Onset  . Hepatitis C Father   . Cirrhosis Father   . Congestive Heart Failure Mother   . Down syndrome Brother   . Aneurysm Sister        brain  . Diabetes Sister   . Hypertension Sister   . High Cholesterol Sister   . Heart attack Sister   . Hypertension Sister   . High Cholesterol Sister   . Stroke Sister   . Hypertension Sister   .  High Cholesterol Sister     Social History   Tobacco Use  . Smoking status: Current Every Day Smoker    Packs/day: 0.50    Types: Cigarettes  . Smokeless tobacco: Never Used  Vaping Use  . Vaping Use: Never used  Substance Use Topics  . Alcohol use: Not Currently  . Drug use: Not Currently    Home Medications Prior to Admission medications   Medication Sig Start Date End Date Taking? Authorizing Provider  misoprostol (CYTOTEC) 200 MCG tablet Place 4 tablets (800 mcg total) vaginally once for 1 dose. Patient not taking: Reported on 09/08/2020 08/02/19 08/02/19  Tilda Burrow, MD  prenatal vitamin w/FE, FA (PRENATAL 1 + 1) 27-1 MG TABS tablet Take 1 tablet by mouth daily at 12 noon. Patient not taking: Reported on 08/02/2019 05/30/19   Adline Potter, NP  promethazine (PHENERGAN) 25 MG tablet Take 1 tablet (25 mg total) by mouth every 6 (six) hours as needed for nausea or vomiting. Patient not taking: Reported on 08/02/2019 05/30/19   Adline Potter, NP    Allergies  Patient has no known allergies.  Review of Systems   Review of Systems Ten systems reviewed and are negative for acute change, except as noted in the HPI.   Physical Exam Updated Vital Signs BP 135/90 (BP Location: Right Arm)   Temp 99 F (37.2 C) (Oral)   Resp 16   Ht 5' (1.524 m)   Wt 74.8 kg   LMP 08/27/2020 (Exact Date)   SpO2 98%   BMI 32.22 kg/m   Physical Exam Vitals and nursing note reviewed.  Constitutional:      General: She is not in acute distress.    Appearance: She is well-developed. She is ill-appearing. She is not toxic-appearing or diaphoretic.  HENT:     Head: Normocephalic and atraumatic.  Eyes:     General: No scleral icterus.    Conjunctiva/sclera: Conjunctivae normal.  Cardiovascular:     Rate and Rhythm: Normal rate and regular rhythm.     Heart sounds: Normal heart sounds. No murmur heard.  No friction rub. No gallop.   Pulmonary:     Effort: Pulmonary effort is  normal. No respiratory distress.     Breath sounds: Normal breath sounds.  Abdominal:     General: Bowel sounds are normal. There is no distension.     Palpations: Abdomen is soft. There is no mass.     Tenderness: There is no abdominal tenderness. There is no guarding.  Musculoskeletal:     Cervical back: Normal range of motion.  Skin:    General: Skin is warm and dry.  Neurological:     Mental Status: She is alert and oriented to person, place, and time.  Psychiatric:        Behavior: Behavior normal.     ED Results / Procedures / Treatments   Labs (all labs ordered are listed, but only abnormal results are displayed) Labs Reviewed  RESPIRATORY PANEL BY RT PCR (FLU A&B, COVID)    EKG None  Radiology DG Chest Portable 1 View  Result Date: 09/08/2020 CLINICAL DATA:  Cough and shortness of breath, history of COVID exposure EXAM: PORTABLE CHEST 1 VIEW COMPARISON:  04/10/2019 FINDINGS: The heart size and mediastinal contours are within normal limits. Both lungs are clear. The visualized skeletal structures are unremarkable. IMPRESSION: No active disease. Electronically Signed   By: Alcide Clever M.D.   On: 09/08/2020 17:46    Procedures Procedures (including critical care time)  Medications Ordered in ED Medications - No data to display  ED Course  I have reviewed the triage vital signs and the nursing notes.  Pertinent labs & imaging results that were available during my care of the patient were reviewed by me and considered in my medical decision making (see chart for details).    MDM Rules/Calculators/A&P                         Patient here with symptoms of upper respiratory infection and flulike illness, negative for Covid, flu and RSV.  She has had close exposure in the home.  Given referral [facility] clinic for subcutaneous prophylactic dose. I have high suspicion the patient just has a false negative test.  Patient is afebrile and hemodynamically stable.   Appears appropriate for discharge at this time Melissa Ramsey was evaluated in Emergency Department on 09/08/2020 for the symptoms described in the history of present illness. She was evaluated in the context of the global COVID-19 pandemic, which necessitated consideration that the patient  might be at risk for infection with the SARS-CoV-2 virus that causes COVID-19. Institutional protocols and algorithms that pertain to the evaluation of patients at risk for COVID-19 are in a state of rapid change based on information released by regulatory bodies including the CDC and federal and state organizations. These policies and algorithms were followed during the patient's care in the ED.   Final Clinical Impression(s) / ED Diagnoses Final diagnoses:  None    Rx / DC Orders ED Discharge Orders    None       Arthor Captain, PA-C 09/08/20 2142    Bethann Berkshire, MD 09/10/20 1023

## 2020-09-09 ENCOUNTER — Telehealth: Payer: Self-pay | Admitting: *Deleted

## 2020-09-09 ENCOUNTER — Telehealth: Payer: Self-pay

## 2020-09-09 DIAGNOSIS — F4323 Adjustment disorder with mixed anxiety and depressed mood: Secondary | ICD-10-CM | POA: Diagnosis not present

## 2020-09-09 NOTE — Telephone Encounter (Signed)
Emailed request to [facility] to schedule  NP appointment .   Melissa Ramsey PEC [phone]                                                                                       

## 2020-09-09 NOTE — Telephone Encounter (Signed)
Transition Care Management Follow-up Telephone Call  Date of discharge and from where: [facility] 09[facility]08/[facility]  How have you been since you were released from the hospital? Doing well   Any questions or concerns? No  Items Reviewed:  Did the pt receive and understand the discharge instructions provided? Yes   Medications obtained and verified? Yes   Any new allergies since your discharge? No  Dietary orders reviewed? Yes  Do you have support at home? Yes   Functional Questionnaire: (I = Independent and D = Dependent) ADLs: I  Bathing/Dressing- I  Meal Prep- I  Eating- I  Maintaining continence- I  Transferring/Ambulation- I  Managing Meds- I  Follow up appointments reviewed:   PCP Hospital f/u appt confirmed? No .  Specialist Hospital f/u appt confirmed? No    Are transportation arrangements needed? No   If their condition worsens, is the pt aware to call PCP or go to the Emergency Dept.? Yes  Was the patient provided with contact information for the PCP's office or ED? Yes  Was to pt encouraged to call back with questions or concerns? Yes

## 2020-09-16 DIAGNOSIS — F4323 Adjustment disorder with mixed anxiety and depressed mood: Secondary | ICD-10-CM | POA: Diagnosis not present

## 2020-09-24 ENCOUNTER — Other Ambulatory Visit: Payer: Self-pay

## 2020-09-24 ENCOUNTER — Encounter: Payer: Self-pay | Admitting: Internal Medicine

## 2020-09-24 ENCOUNTER — Ambulatory Visit (INDEPENDENT_AMBULATORY_CARE_PROVIDER_SITE_OTHER): Payer: Medicaid Other | Admitting: Internal Medicine

## 2020-09-24 VITALS — BP 134/84 | HR 99 | Temp 97.2°F | Resp 18 | Ht 60.0 in | Wt 167.1 lb

## 2020-09-24 DIAGNOSIS — U099 Post covid-19 condition, unspecified: Secondary | ICD-10-CM | POA: Diagnosis not present

## 2020-09-24 DIAGNOSIS — E669 Obesity, unspecified: Secondary | ICD-10-CM | POA: Insufficient documentation

## 2020-09-24 DIAGNOSIS — Z1211 Encounter for screening for malignant neoplasm of colon: Secondary | ICD-10-CM

## 2020-09-24 DIAGNOSIS — Z720 Tobacco use: Secondary | ICD-10-CM

## 2020-09-24 DIAGNOSIS — Z7689 Persons encountering health services in other specified circumstances: Secondary | ICD-10-CM | POA: Diagnosis not present

## 2020-09-24 DIAGNOSIS — R569 Unspecified convulsions: Secondary | ICD-10-CM | POA: Diagnosis not present

## 2020-09-24 DIAGNOSIS — G250 Essential tremor: Secondary | ICD-10-CM | POA: Insufficient documentation

## 2020-09-24 DIAGNOSIS — Z1231 Encounter for screening mammogram for malignant neoplasm of breast: Secondary | ICD-10-CM

## 2020-09-24 DIAGNOSIS — N3000 Acute cystitis without hematuria: Secondary | ICD-10-CM

## 2020-09-24 DIAGNOSIS — F4323 Adjustment disorder with mixed anxiety and depressed mood: Secondary | ICD-10-CM | POA: Diagnosis not present

## 2020-09-24 DIAGNOSIS — I10 Essential (primary) hypertension: Secondary | ICD-10-CM

## 2020-09-24 HISTORY — DX: Essential (primary) hypertension: I10

## 2020-09-24 LAB — POCT URINALYSIS DIP (CLINITEK)
Bilirubin, UA: NEGATIVE
Blood, UA: NEGATIVE
Glucose, UA: NEGATIVE mg/dL
Ketones, POC UA: NEGATIVE mg/dL
Leukocytes, UA: NEGATIVE
Nitrite, UA: NEGATIVE
POC PROTEIN,UA: NEGATIVE
Spec Grav, UA: 1.01 (ref 1.010–1.025)
Urobilinogen, UA: 0.2 E.U./dL
pH, UA: 5.5 (ref 5.0–8.0)

## 2020-09-24 NOTE — Assessment & Plan Note (Signed)
Care established Previous chart reviewed History and medications reviewed with the patient 

## 2020-09-24 NOTE — Progress Notes (Signed)
New Patient Office Visit  Subjective:  Patient ID: Melissa Ramsey, female    DOB: 1976-05-09  Age: 44 y.o. MRN: [id number]  CC:  Chief Complaint  Patient presents with  . New Patient (Initial Visit)    new pt she is having pain in her lungs and tingling on her body is sure she had covid as her brother tested positive 9-26 and she lost taste smell etc but tested negative     HPI Melissa BeachShenequa Broxterman is a 44 year old female with past medical history of obesity and tobacco abuse presents for establishing care.  Patient has not had PCP follow-up long time.  She states that she was exposed to 2 family members who tested Covid positive.  She also had chills, cough, loss of smell and taste sensation about 2 weeks ago.  Currently, she denies any fever, chills, dyspnea,or palpitations.  She still has cough and associated pleuritic chest pain.  She also has loss of smell and taste sensation yet.  She also complains of tingling sensation in lower bilateral upper and lower extremities for the last 2 weeks.  Patient herself did not get tested for Covid.  She also mentioned episodes of generalized shaking of hands and legs with rolled back eyes. She has had about 4 episodes in last year.  These episodes were witnessed by her husband.  She is unsure of the duration or loss of consciousness.  She denies any tongue biting.  She denies any urinary or stool incontinence. She mentions hand tremors while typing at times, which she attributes to smoking. Of note, she takes caffeinated products (coffee/tea/soft drinks) multiple times in a day.  She also complains of chronic bilateral knee pain, worse with walking and taking stairs.  She denies any recent injury.  Patient is concerned about her weight.  Diet modification was discussed with the patient.  She expressed understanding.  Patient smokes about 1/3 packs per day, and has been trying to cut down.  She has been using nicotine patch to help with smoking  cessation.  She states that she had a negative PAP test last year.  She has not had mammography or colonoscopy yet.  Patient has not had Covid vaccination yet.  She denies flu vaccine today.    Past Medical History:  Diagnosis Date  . BP (high blood pressure) 09/24/2020  . History of pre-eclampsia in prior pregnancy, currently pregnant in first trimester 05/30/2019  . Rh negative state in antepartum period     Past Surgical History:  Procedure Laterality Date  . CESAREAN SECTION    . CHOLECYSTECTOMY      Family History  Problem Relation Age of Onset  . Hepatitis C Father   . Cirrhosis Father   . Congestive Heart Failure Mother   . Down syndrome Brother   . Aneurysm Sister        brain  . Diabetes Sister   . Hypertension Sister   . High Cholesterol Sister   . Heart attack Sister   . Hypertension Sister   . High Cholesterol Sister   . Stroke Sister   . Hypertension Sister   . High Cholesterol Sister     Social History   Socioeconomic History  . Marital status: Married    Spouse name: Not on file  . Number of children: Not on file  . Years of education: Not on file  . Highest education level: Not on file  Occupational History  . Not on file  Tobacco Use  .  Smoking status: Current Every Day Smoker    Packs/day: 0.50    Types: Cigarettes  . Smokeless tobacco: Never Used  Vaping Use  . Vaping Use: Never used  Substance and Sexual Activity  . Alcohol use: Not Currently  . Drug use: Not Currently  . Sexual activity: Yes    Birth control/protection: None  Other Topics Concern  . Not on file  Social History Narrative  . Not on file   Social Determinants of Health   Financial Resource Strain:   . Difficulty of Paying Living Expenses: Not on file  Food Insecurity:   . Worried About Programme researcher, broadcasting/film/video in the Last Year: Not on file  . Ran Out of Food in the Last Year: Not on file  Transportation Needs:   . Lack of Transportation (Medical): Not on file  .  Lack of Transportation (Non-Medical): Not on file  Physical Activity:   . Days of Exercise per Week: Not on file  . Minutes of Exercise per Session: Not on file  Stress:   . Feeling of Stress : Not on file  Social Connections:   . Frequency of Communication with Friends and Family: Not on file  . Frequency of Social Gatherings with Friends and Family: Not on file  . Attends Religious Services: Not on file  . Active Member of Clubs or Organizations: Not on file  . Attends Banker Meetings: Not on file  . Marital Status: Not on file  Intimate Partner Violence:   . Fear of Current or Ex-Partner: Not on file  . Emotionally Abused: Not on file  . Physically Abused: Not on file  . Sexually Abused: Not on file    ROS Review of Systems  Constitutional: Positive for fatigue. Negative for chills and fever.  HENT: Negative for congestion, postnasal drip, rhinorrhea, sinus pressure, sinus pain and sore throat.   Eyes: Negative for pain and discharge.  Respiratory: Positive for cough. Negative for shortness of breath.   Cardiovascular: Positive for chest pain. Negative for palpitations.  Gastrointestinal: Negative for abdominal pain, constipation, diarrhea, nausea and vomiting.  Endocrine: Negative for polydipsia and polyuria.  Genitourinary: Negative for dysuria and hematuria.  Musculoskeletal: Negative for neck pain and neck stiffness.  Skin: Negative for rash.  Neurological: Positive for tremors and seizures (?). Negative for dizziness, speech difficulty and weakness.  Psychiatric/Behavioral: Negative for agitation and behavioral problems.    Objective:   Today's Vitals: BP 134/84 (BP Location: Right Arm, Patient Position: Sitting, Cuff Size: Normal)   Pulse 99   Temp (!) 97.2 F (36.2 C) (Temporal)   Resp 18   Ht 5' (1.524 m)   Wt 167 lb 1.9 oz (75.8 kg)   LMP 08/27/2020 (Exact Date)   SpO2 99%   BMI 32.64 kg/m   Physical Exam Vitals reviewed.  Constitutional:       General: She is not in acute distress.    Appearance: She is not diaphoretic.  HENT:     Head: Normocephalic and atraumatic.     Nose: Nose normal.     Mouth/Throat:     Mouth: Mucous membranes are moist.  Eyes:     General: No scleral icterus.    Extraocular Movements: Extraocular movements intact.     Pupils: Pupils are equal, round, and reactive to light.  Cardiovascular:     Rate and Rhythm: Normal rate and regular rhythm.     Pulses: Normal pulses.     Heart sounds: Normal heart  sounds. No murmur heard.   Pulmonary:     Breath sounds: Normal breath sounds. No wheezing or rales.  Abdominal:     Palpations: Abdomen is soft.     Tenderness: There is no abdominal tenderness.  Musculoskeletal:        General: No swelling or signs of injury.     Cervical back: Neck supple. No tenderness.     Right lower leg: No edema.     Left lower leg: No edema.  Skin:    General: Skin is warm.     Findings: No rash.  Neurological:     General: No focal deficit present.     Mental Status: She is alert and oriented to person, place, and time.     Cranial Nerves: No cranial nerve deficit.     Sensory: No sensory deficit.     Motor: No weakness.  Psychiatric:        Mood and Affect: Mood normal.        Behavior: Behavior normal.     Assessment & Plan:   Problem List Items Addressed This Visit    Encounter to establish care Care established Previous chart reviewed History and medications reviewed with the patient  Post-COVID syndrome Generalized fatigue, cough with pleuritic chest pain, loss of smell and taste with tingling sensation over b/l UE and LE Observe for now Multivitamins and over-the-counter cough medicines as needed  Seizure-like activity (HCC) Reports episodes of shaking movements with rolled eyes, about 4 times in the past year. Witnessed by her husband. Unsure of LOC or duration of symptoms Referral to Neurology for evaluation of possible seizure  disorder  Obesity (BMI 30.0-34.9) Diet modification and moderate exercise, at least 150 mins/week DASH diet mainly for hypertension  BP (high blood pressure) BP: 134/84  Advised to check BP at home DASH diet advised and moderate exercise as tolerated F/u CMP and lipid profile  Tobacco abuse Asked about quitting: confirms that she currently smokes cigarettes Advise to quit smoking: Educated about QUITTING to reduce the risk of cancer, cardio and cerebrovascular disease. Assess willingness: Unwilling to quit at this time, but is working on cutting back. Assist with counseling and pharmacotherapy: Counseled for 5 minutes and literature provided. Arrange for follow up: follow up in 3 weeks and continue to offer help.  Benign essential tremor Advised to avoid caffeinated products and quit smoking Will try behavioral modification. If resistant, can try Propranolol.     Other Visit Diagnoses    Post covid-19 condition, unspecified       Essential tremor       Relevant Orders   Ambulatory referral to Neurology   Screening mammogram for breast cancer       Relevant Orders   MM Digital Screening   Special screening for malignant neoplasms, colon       Relevant Orders   Ambulatory referral to Gastroenterology   Acute cystitis without hematuria       Relevant Orders   POCT URINALYSIS DIP (CLINITEK) (Completed)      Outpatient Encounter Medications as of 09/24/2020  Medication Sig  . prenatal vitamin w/FE, FA (PRENATAL 1 + 1) 27-1 MG TABS tablet Take 1 tablet by mouth daily at 12 noon.  . [DISCONTINUED] misoprostol (CYTOTEC) 200 MCG tablet Place 4 tablets (800 mcg total) vaginally once for 1 dose. (Patient not taking: Reported on 09/08/2020)  . [DISCONTINUED] promethazine (PHENERGAN) 25 MG tablet Take 1 tablet (25 mg total) by mouth every 6 (six) hours as  needed for nausea or vomiting. (Patient not taking: Reported on 08/02/2019)   No facility-administered encounter medications on  file as of 09/24/2020.    Follow-up: Return in about 4 weeks (around 10/22/2020).   Anabel Halon, MD

## 2020-09-24 NOTE — Patient Instructions (Signed)
Please try to quit smoking. Okay to use Nicotine patch.  Please get blood tests done before breakfast in the morning.  You are being referred to Neurology for evaluation of seizures.  You are being referred to Gastroenterology for possible colonoscopy.  Please avoid caffeinated products to help with the tremors.  Please follow DASH diet for better control of blood pressure. DASH stands for Dietary Approaches to Stop Hypertension. The DASH diet is a healthy-eating plan designed to help treat or prevent high blood pressure (hypertension).  The DASH diet includes foods that are rich in potassium, calcium and magnesium. These nutrients help control blood pressure. The diet limits foods that are high in sodium, saturated fat and added sugars.  Studies have shown that the DASH diet can lower blood pressure in as little as two weeks. The diet can also lower low-density lipoprotein (LDL or "bad") cholesterol levels in the blood. High blood pressure and high LDL cholesterol levels are two major risk factors for heart disease and stroke.    DASH diet: Recommended servings The DASH diet provides daily and weekly nutritional goals. The number of servings you should have depends on your daily calorie needs.  Here's a look at the recommended servings from each food group for a 2,000-calorie-a-day DASH diet:  Grains: 6 to 8 servings a day. One serving is one slice bread, 1 ounce dry cereal, or 1/2 cup cooked cereal, rice or pasta. Vegetables: 4 to 5 servings a day. One serving is 1 cup raw leafy green vegetable, 1/2 cup cut-up raw or cooked vegetables, or 1/2 cup vegetable juice. Fruits: 4 to 5 servings a day. One serving is one medium fruit, 1/2 cup fresh, frozen or canned fruit, or 1/2 cup fruit juice. Fat-free or low-fat dairy products: 2 to 3 servings a day. One serving is 1 cup milk or yogurt, or 1 1/2 ounces cheese. Lean meats, poultry and fish: six 1-ounce servings or fewer a day. One serving is  1 ounce cooked meat, poultry or fish, or 1 egg. Nuts, seeds and legumes: 4 to 5 servings a week. One serving is 1/3 cup nuts, 2 tablespoons peanut butter, 2 tablespoons seeds, or 1/2 cup cooked legumes (dried beans or peas). Fats and oils: 2 to 3 servings a day. One serving is 1 teaspoon soft margarine, 1 teaspoon vegetable oil, 1 tablespoon mayonnaise or 2 tablespoons salad dressing. Sweets and added sugars: 5 servings or fewer a week. One serving is 1 tablespoon sugar, jelly or jam, 1/2 cup sorbet, or 1 cup lemonade.

## 2020-09-24 NOTE — Assessment & Plan Note (Signed)
Reports episodes of shaking movements with rolled eyes, about 4 times in the past year. Witnessed by her husband. Unsure of LOC or duration of symptoms Referral to Neurology for evaluation of possible seizure disorder

## 2020-09-24 NOTE — Assessment & Plan Note (Signed)
Generalized fatigue, cough with pleuritic chest pain, loss of smell and taste with tingling sensation over b/l UE and LE Observe for now Multivitamins and over-the-counter cough medicines as needed

## 2020-09-24 NOTE — Assessment & Plan Note (Signed)
Asked about quitting: confirms that she currently smokes cigarettes Advise to quit smoking: Educated about QUITTING to reduce the risk of cancer, cardio and cerebrovascular disease. Assess willingness: Unwilling to quit at this time, but is working on cutting back. Assist with counseling and pharmacotherapy: Counseled for 5 minutes and literature provided. Arrange for follow up: follow up in 3 weeks and continue to offer help.

## 2020-09-24 NOTE — Assessment & Plan Note (Signed)
BP: 134/84  Advised to check BP at home DASH diet advised and moderate exercise as tolerated F/u CMP and lipid profile

## 2020-09-24 NOTE — Assessment & Plan Note (Signed)
Diet modification and moderate exercise, at least 150 mins/week DASH diet mainly for hypertension

## 2020-09-24 NOTE — Assessment & Plan Note (Signed)
Advised to avoid caffeinated products and quit smoking Will try behavioral modification. If resistant, can try Propranolol.

## 2020-09-25 ENCOUNTER — Encounter (INDEPENDENT_AMBULATORY_CARE_PROVIDER_SITE_OTHER): Payer: Self-pay | Admitting: *Deleted

## 2020-09-25 DIAGNOSIS — Z7689 Persons encountering health services in other specified circumstances: Secondary | ICD-10-CM | POA: Diagnosis not present

## 2020-09-26 LAB — T4 AND TSH
T4, Total: 9.5 ug/dL (ref 4.5–12.0)
TSH: 2.45 u[IU]/mL (ref 0.450–4.500)

## 2020-09-26 LAB — CBC WITH DIFFERENTIAL/PLATELET
Basophils Absolute: 0 10*3/uL (ref 0.0–0.2)
Basos: 0 %
EOS (ABSOLUTE): 0.1 10*3/uL (ref 0.0–0.4)
Eos: 1 %
Hematocrit: 42.4 % (ref 34.0–46.6)
Hemoglobin: 13.5 g/dL (ref 11.1–15.9)
Immature Grans (Abs): 0 10*3/uL (ref 0.0–0.1)
Immature Granulocytes: 0 %
Lymphocytes Absolute: 3.7 10*3/uL — ABNORMAL HIGH (ref 0.7–3.1)
Lymphs: 52 %
MCH: 27.8 pg (ref 26.6–33.0)
MCHC: 31.8 g/dL (ref 31.5–35.7)
MCV: 87 fL (ref 79–97)
Monocytes Absolute: 0.6 10*3/uL (ref 0.1–0.9)
Monocytes: 8 %
Neutrophils Absolute: 2.8 10*3/uL (ref 1.4–7.0)
Neutrophils: 39 %
Platelets: 217 10*3/uL (ref 150–450)
RBC: 4.86 x10E6/uL (ref 3.77–5.28)
RDW: 13.7 % (ref 11.7–15.4)
WBC: 7.2 10*3/uL (ref 3.4–10.8)

## 2020-09-26 LAB — CMP14+EGFR
ALT: 39 IU/L — ABNORMAL HIGH (ref 0–32)
AST: 35 IU/L (ref 0–40)
Albumin/Globulin Ratio: 1 — ABNORMAL LOW (ref 1.2–2.2)
Albumin: 4.2 g/dL (ref 3.8–4.8)
Alkaline Phosphatase: 116 IU/L (ref 44–121)
BUN/Creatinine Ratio: 15 (ref 9–23)
BUN: 15 mg/dL (ref 6–24)
Bilirubin Total: 0.4 mg/dL (ref 0.0–1.2)
CO2: 20 mmol/L (ref 20–29)
Calcium: 9.6 mg/dL (ref 8.7–10.2)
Chloride: 101 mmol/L (ref 96–106)
Creatinine, Ser: 0.97 mg/dL (ref 0.57–1.00)
GFR calc Af Amer: 82 mL/min/{1.73_m2} (ref >59)
GFR calc non Af Amer: 71 mL/min/{1.73_m2} (ref >59)
Globulin, Total: 4.2 g/dL (ref 1.5–4.5)
Glucose: 111 mg/dL — ABNORMAL HIGH (ref 65–99)
Potassium: 4.6 mmol/L (ref 3.5–5.2)
Sodium: 141 mmol/L (ref 134–144)
Total Protein: 8.4 g/dL (ref 6.0–8.5)

## 2020-09-26 LAB — LIPID PANEL
Chol/HDL Ratio: 6.6 ratio — ABNORMAL HIGH (ref 0.0–4.4)
Cholesterol, Total: 199 mg/dL (ref 100–199)
HDL: 30 mg/dL — ABNORMAL LOW (ref >39)
LDL Chol Calc (NIH): 98 mg/dL (ref 0–99)
Triglycerides: 419 mg/dL — ABNORMAL HIGH (ref 0–149)
VLDL Cholesterol Cal: 71 mg/dL — ABNORMAL HIGH (ref 5–40)

## 2020-09-26 LAB — HEPATITIS C ANTIBODY: Hep C Virus Ab: 0.2 s/co ratio (ref 0.0–0.9)

## 2020-09-26 LAB — HIV ANTIBODY (ROUTINE TESTING W REFLEX): HIV Screen 4th Generation wRfx: NONREACTIVE

## 2020-09-26 LAB — HEMOGLOBIN A1C
Est. average glucose Bld gHb Est-mCnc: 123 mg/dL
Hgb A1c MFr Bld: 5.9 % — ABNORMAL HIGH (ref 4.8–5.6)

## 2020-09-27 ENCOUNTER — Other Ambulatory Visit: Payer: Self-pay

## 2020-09-27 ENCOUNTER — Ambulatory Visit
Admission: RE | Admit: 2020-09-27 | Discharge: 2020-09-27 | Disposition: A | Discharge status: Home / Self Care | Payer: Medicaid Other | Source: Ambulatory Visit | Attending: Internal Medicine | Admitting: Internal Medicine

## 2020-09-27 DIAGNOSIS — Z1231 Encounter for screening mammogram for malignant neoplasm of breast: Secondary | ICD-10-CM | POA: Diagnosis not present

## 2020-10-07 DIAGNOSIS — F4323 Adjustment disorder with mixed anxiety and depressed mood: Secondary | ICD-10-CM | POA: Diagnosis not present

## 2020-10-15 DIAGNOSIS — F4323 Adjustment disorder with mixed anxiety and depressed mood: Secondary | ICD-10-CM | POA: Diagnosis not present

## 2020-10-21 DIAGNOSIS — F4323 Adjustment disorder with mixed anxiety and depressed mood: Secondary | ICD-10-CM | POA: Diagnosis not present

## 2020-10-22 ENCOUNTER — Encounter: Payer: Self-pay | Admitting: Internal Medicine

## 2020-10-22 ENCOUNTER — Telehealth (INDEPENDENT_AMBULATORY_CARE_PROVIDER_SITE_OTHER): Payer: Medicaid Other | Admitting: Internal Medicine

## 2020-10-22 ENCOUNTER — Other Ambulatory Visit: Payer: Self-pay

## 2020-10-22 DIAGNOSIS — I10 Essential (primary) hypertension: Secondary | ICD-10-CM

## 2020-10-22 DIAGNOSIS — G6289 Other specified polyneuropathies: Secondary | ICD-10-CM

## 2020-10-22 DIAGNOSIS — E781 Pure hyperglyceridemia: Secondary | ICD-10-CM | POA: Diagnosis not present

## 2020-10-22 DIAGNOSIS — Z720 Tobacco use: Secondary | ICD-10-CM | POA: Diagnosis not present

## 2020-10-22 DIAGNOSIS — R7303 Prediabetes: Secondary | ICD-10-CM

## 2020-10-22 MED ORDER — GABAPENTIN 300 MG PO CAPS: 300.0000 mg | ORAL_CAPSULE | Freq: Every day | ORAL | 3 refills | Status: DC

## 2020-10-22 MED ORDER — VARENICLINE TARTRATE 0.5 MG PO TABS: ORAL_TABLET | ORAL | 0 refills | Status: DC

## 2020-10-22 MED ORDER — VARENICLINE TARTRATE 1 MG PO TABS: 1.0000 mg | ORAL_TABLET | Freq: Two times a day (BID) | ORAL | 1 refills | Status: DC

## 2020-10-22 NOTE — Progress Notes (Signed)
Virtual Visit via Telephone Note   This visit type was conducted due to national recommendations for restrictions regarding the COVID-19 Pandemic (e.g. social distancing) in an effort to limit this patient's exposure and mitigate transmission in our community.  Due to her co-morbid illnesses, this patient is at least at moderate risk for complications without adequate follow up.  This format is felt to be most appropriate for this patient at this time.  The patient did not have access to video technology/had technical difficulties with video requiring transitioning to audio format only (telephone).  All issues noted in this document were discussed and addressed.  No physical exam could be performed with this format.  Evaluation Performed:  Follow-up visit  Date:  10/22/2020   ID:  Melissa Ramsey, DOB Aug 15, 1976, MRN [id number]  Patient Location: Home Provider Location: Office/Clinic  Location of Patient: Home Location of Provider: Telehealth Consent was obtain for visit to be over via telehealth. I verified that I am speaking with the correct person using two identifiers.  PCP:  Anabel Halon, MD   Chief Complaint:    History of Present Illness:    Melissa Ramsey is a 44 y.o. female with past medical history of possible seizures, obesity and tobacco abuse who has a televisit to discuss about her medical conditions.  She states that she has been in good health overall. She has not had any seizure-like activity since the last visit. She is awaiting for the neurology evaluation. She has tried to cut down on caffeinated products, and has been noticing some improvement with the tremors.  Patient did not check her blood pressure since the last visit. She has been trying to follow DASH diet as advised.  She complains of burning sensation in the hands and feet, associated with some tingling and numbness sensation. Disease symptoms have been disturbing her sleep.  Patient continues to  smoke, and admits that she smokes up to 0.5 packs/day when she is stressed. She is willing to try Chantix now.  Blood test results were discussed with the patient in detail.  The patient does not have symptoms concerning for COVID-19 infection (fever, chills, cough, or new shortness of breath).   Past Medical, Surgical, Social History, Allergies, and Medications have been Reviewed.  Past Medical History:  Diagnosis Date   BP (high blood pressure) 09/24/2020   History of pre-eclampsia in prior pregnancy, currently pregnant in first trimester 05/30/2019   Rh negative state in antepartum period    Past Surgical History:  Procedure Laterality Date   CESAREAN SECTION     CHOLECYSTECTOMY       No outpatient medications have been marked as taking for the 10/22/20 encounter (Video Visit) with Anabel Halon, MD.     Allergies:   Patient has no known allergies.   ROS:   Please see the history of present illness.    Review of Systems  Constitutional: Negative for chills and fever.  HENT: Negative for congestion, sinus pain and sore throat.   Eyes: Negative for pain and redness.  Respiratory: Negative for cough and shortness of breath.   Cardiovascular: Negative for chest pain, palpitations and leg swelling.  Gastrointestinal: Negative for constipation, diarrhea, nausea and vomiting.  Genitourinary: Negative for dysuria and hematuria.  Musculoskeletal: Negative for falls and neck pain.  Skin: Negative for rash.  Neurological: Positive for tingling. Negative for dizziness and focal weakness.  Psychiatric/Behavioral: Negative for depression. The patient has insomnia.     Labs/Other Tests  and Data Reviewed:    Recent Labs: 09/25/2020: ALT 39; BUN 15; Creatinine, Ser 0.97; Hemoglobin 13.5; Platelets 217; Potassium 4.6; Sodium 141; TSH 2.450   Recent Lipid Panel Lab Results  Component Value Date/Time   CHOL 199 09/25/2020 09:21 AM   TRIG 419 (H) 09/25/2020 09:21 AM   HDL 30 (L)  09/25/2020 09:21 AM   CHOLHDL 6.6 (H) 09/25/2020 09:21 AM   LDLCALC 98 09/25/2020 09:21 AM    Wt Readings from Last 3 Encounters:  09/24/20 167 lb 1.9 oz (75.8 kg)  09/08/20 165 lb (74.8 kg)  08/02/19 164 lb (74.4 kg)     ASSESSMENT & PLAN:    Prediabetes Lab Results  Component Value Date   HGBA1C 5.9 (H) 09/25/2020  Advised to follow low carbohydrate diet  Peripheral neuropathy Likely related to insulin resistance Gabapentin 300 mg once daily  Tobacco abuse Asked about quitting: confirms that she currently smokes cigarettes Advise to quit smoking: Educated about QUITTING to reduce the risk of cancer, cardio and cerebrovascular disease. Assess willingness: Unwilling to quit at this time, but is working on cutting back. Assist with counseling and pharmacotherapy: Counseled for 5 minutes and literature provided. Prescribed Chantix. Arrange for follow up: Follow up and continue to offer help.  Hypertriglyceridemia Advised to follow DASH diet as discussed Moderate exercise at least 150 mins/week  High BP - noted in last visit Advised to check BP at home/pharmacy and call us if BP > 140/90 Continue to follow DASH diet and perform moderate exercise at least 150 minutes/week.   Time:   Today, I have spent 24 minutes with the patient with telehealth technology discussing the above problems.     Medication Adjustments/Labs and Tests Ordered: Current medicines are reviewed at length with the patient today.  Concerns regarding medicines are outlined above.   Tests Ordered: No orders of the defined types were placed in this encounter.   Medication Changes: No orders of the defined types were placed in this encounter.    Note: This dictation was prepared with Dragon dictation along with smaller phrase technology. Similar sounding words can be transcribed inadequately or may not be corrected upon review. Any transcriptional errors that result from this process are  unintentional.      Disposition:  Follow up  Signed, Anabel Halon, MD  10/22/2020 1:13 PM     [facility]

## 2020-10-22 NOTE — Patient Instructions (Signed)
Please start taking Chantix as prescribed for smoking cessation.  Please start taking gabapentin for burning pain in hands and feet (peripheral neuropathy). Please take vitamin B-12 1000 mcg once daily.  Please follow DASH diet and perform moderate exercise at least 150 minutes/week.  Please follow-up with neurologist as scheduled.

## 2020-10-28 DIAGNOSIS — F4323 Adjustment disorder with mixed anxiety and depressed mood: Secondary | ICD-10-CM | POA: Diagnosis not present

## 2020-11-11 DIAGNOSIS — F4323 Adjustment disorder with mixed anxiety and depressed mood: Secondary | ICD-10-CM | POA: Diagnosis not present

## 2020-11-18 DIAGNOSIS — F4323 Adjustment disorder with mixed anxiety and depressed mood: Secondary | ICD-10-CM | POA: Diagnosis not present

## 2020-11-25 DIAGNOSIS — F4323 Adjustment disorder with mixed anxiety and depressed mood: Secondary | ICD-10-CM | POA: Diagnosis not present

## 2020-12-10 DIAGNOSIS — F4323 Adjustment disorder with mixed anxiety and depressed mood: Secondary | ICD-10-CM | POA: Diagnosis not present

## 2020-12-16 DIAGNOSIS — F4323 Adjustment disorder with mixed anxiety and depressed mood: Secondary | ICD-10-CM | POA: Diagnosis not present

## 2020-12-24 ENCOUNTER — Ambulatory Visit: Payer: Medicaid Other | Admitting: Neurology

## 2020-12-24 ENCOUNTER — Encounter: Payer: Self-pay | Admitting: Neurology

## 2020-12-24 VITALS — BP 119/73 | HR 105 | Ht 60.0 in | Wt 176.0 lb

## 2020-12-24 DIAGNOSIS — R42 Dizziness and giddiness: Secondary | ICD-10-CM

## 2020-12-24 DIAGNOSIS — R519 Headache, unspecified: Secondary | ICD-10-CM | POA: Diagnosis not present

## 2020-12-24 DIAGNOSIS — R0602 Shortness of breath: Secondary | ICD-10-CM

## 2020-12-24 MED ORDER — METOPROLOL SUCCINATE ER 25 MG PO TB24: 25.0000 mg | ORAL_TABLET | Freq: Every day | ORAL | 3 refills | Status: AC

## 2020-12-24 NOTE — Progress Notes (Signed)
Chief Complaint  Patient presents with  . New Patient (Initial Visit)    Patient states that she had an episode a few months ago of severe dizziness and blurred vision. She now has SOB and headaches.    HISTORICAL  Melissa Ramsey is a 45 year old female, seen in request by her primary care physician Dr. Allena Katz, Earlie Lou, for evaluation of dizziness, shortness of breath, initial evaluation was on December 24, 2020.  I reviewed and summarized the referring note.  She has not received COVID-vaccine, in September 2021, her brother and her daughter with test positive, she also developed symptoms of lost sense of smell, taste lasting for 2 weeks, COVID PCR testing on September 08, 2020 were negative, she was told potentially false negative.  She used to smoke half to 1 pack a day, following COVID-like symptoms, she decided to quit smoking,  In October 2021, she began to experience bilateral lower extremity swelling, shortness of breath, lightheadedness when getting up from seated position or bending over, her symptoms gradually getting worse,  Now she has to wear compression stockings, sleeping 3-4 pillows stacked together, tachycardia, paced heart rate more than 100, continue to have lightheadedness with exertion, weight gain of 7 pounds in over past 3 months, despite tight diet control,  She also complains of new onset bilateral frontal, base of the skull pressure headache, mild to moderate, daily, she denies a history of headache in the past,  Laboratory evaluation October 2021: Negative HIV, TSH, lipid profile showed elevated triglyceride 419, LDL 98, HDL decreased 30, R5J of 5.9, negative hepatitis C antibody, CMP, CBC hemoglobin of 13.5   CXR in September 2021 was normal    REVIEW OF SYSTEMS: Full 14 system review of systems performed and notable only for as above All other review of systems were negative.  ALLERGIES: No Known Allergies  HOME MEDICATIONS: No current outpatient  medications on file.   No current facility-administered medications for this visit.    PAST MEDICAL HISTORY: Past Medical History:  Diagnosis Date  . BP (high blood pressure) 09/24/2020  . History of pre-eclampsia in prior pregnancy, currently pregnant in first trimester 05/30/2019  . Rh negative state in antepartum period     PAST SURGICAL HISTORY: Past Surgical History:  Procedure Laterality Date  . CESAREAN SECTION    . CHOLECYSTECTOMY      FAMILY HISTORY: Family History  Problem Relation Age of Onset  . Hepatitis C Father   . Cirrhosis Father   . Congestive Heart Failure Mother   . Down syndrome Brother   . Aneurysm Sister        brain  . Diabetes Sister   . Hypertension Sister   . High Cholesterol Sister   . Heart attack Sister   . Hypertension Sister   . High Cholesterol Sister   . Stroke Sister   . Hypertension Sister   . High Cholesterol Sister     SOCIAL HISTORY: Social History   Socioeconomic History  . Marital status: Married    Spouse name: Not on file  . Number of children: Not on file  . Years of education: Not on file  . Highest education level: Not on file  Occupational History  . Not on file  Tobacco Use  . Smoking status: Former Smoker    Packs/day: 0.50    Types: Cigarettes  . Smokeless tobacco: Never Used  Vaping Use  . Vaping Use: Never used  Substance and Sexual Activity  . Alcohol use: Not  Currently  . Drug use: Not Currently  . Sexual activity: Yes    Birth control/protection: None  Other Topics Concern  . Not on file  Social History Narrative  . Not on file   Social Determinants of Health   Financial Resource Strain: Not on file  Food Insecurity: Not on file  Transportation Needs: Not on file  Physical Activity: Not on file  Stress: Not on file  Social Connections: Not on file  Intimate Partner Violence: Not on file     PHYSICAL EXAM   Vitals:   12/24/20 1349  BP: 119/73  Pulse: (!) 105  Weight: 176 lb (79.8  kg)  Height: 5' (1.524 m)   Body mass index is 34.37 kg/m.   Blood pressure sitting 123/81, heart rate of 105;  Standing 135/90, HR 114; standing 3 minutes 133/84, HR 115.  PHYSICAL EXAMNIATION:  Gen: NAD, conversant, well nourised, well groomed                     Cardiovascular: Rapid rate regular rhythm, no significant peripheral edema, warm, nontender. Eyes: Conjunctivae clear without exudates or hemorrhage Neck: Supple, no carotid bruits. Pulmonary: Clear to auscultation bilaterally   NEUROLOGICAL EXAM:  MENTAL STATUS: Speech:    Speech is normal; fluent and spontaneous with normal comprehension.  Cognition:     Orientation to time, place and person     Normal recent and remote memory     Normal Attention span and concentration     Normal Language, naming, repeating,spontaneous speech     Fund of knowledge   CRANIAL NERVES: CN II: Visual fields are full to confrontation. Pupils are round equal and briskly reactive to light. CN III, IV, VI: extraocular movement are normal. No ptosis. CN V: Facial sensation is intact to light touch CN VII: Face is symmetric with normal eye closure  CN VIII: Hearing is normal to causal conversation. CN IX, X: Phonation is normal. CN XI: Head turning and shoulder shrug are intact  MOTOR: There is no pronator drift of out-stretched arms. Muscle bulk and tone are normal. Muscle strength is normal.  REFLEXES: Reflexes are 2+ and symmetric at the biceps, triceps, knees, and ankles. Plantar responses are flexor.  SENSORY: Intact to light touch, pinprick and vibratory sensation are intact in fingers and toes.  COORDINATION: There is no trunk or limb dysmetria noted.  GAIT/STANCE: Posture is normal. Gait is steady with normal steps, base, arm swing, and turning. Heel and toe walking are normal. Tandem gait is normal.  Romberg is absent.   DIAGNOSTIC DATA (LABS, IMAGING, TESTING) - I reviewed patient records, labs, notes, testing and  imaging myself where available.   ASSESSMENT AND PLAN  Melissa Ramsey is a 45 y.o. female   New onset headache  MRI of brain to rule out structural abnormalities  Lightheadedness, shortness of breath  This can be related to rapid baseline heart rate  Toprol-XL 25 mg daily for symptomatic treatment  Echocardiogram    Levert Feinstein, M.D. Ph.D.  [facility] [address] [place], [place] [address] Ph: [phone] Fax: [phone]  CC:  Anabel Halon, MD [address] [place],  [place] [address]

## 2020-12-25 ENCOUNTER — Telehealth: Payer: Self-pay | Admitting: Neurology

## 2020-12-25 NOTE — Telephone Encounter (Signed)
[facility] healthy blue pending per insurance can take up to 15 days  

## 2020-12-31 ENCOUNTER — Other Ambulatory Visit: Payer: Self-pay

## 2020-12-31 DIAGNOSIS — F4323 Adjustment disorder with mixed anxiety and depressed mood: Secondary | ICD-10-CM | POA: Diagnosis not present

## 2020-12-31 DIAGNOSIS — E1165 Type 2 diabetes mellitus with hyperglycemia: Secondary | ICD-10-CM | POA: Diagnosis not present

## 2020-12-31 DIAGNOSIS — R739 Hyperglycemia, unspecified: Secondary | ICD-10-CM | POA: Diagnosis not present

## 2020-12-31 DIAGNOSIS — Z87891 Personal history of nicotine dependence: Secondary | ICD-10-CM | POA: Diagnosis not present

## 2020-12-31 DIAGNOSIS — R072 Precordial pain: Secondary | ICD-10-CM | POA: Diagnosis present

## 2020-12-31 DIAGNOSIS — R0602 Shortness of breath: Secondary | ICD-10-CM | POA: Diagnosis not present

## 2020-12-31 DIAGNOSIS — R519 Headache, unspecified: Secondary | ICD-10-CM | POA: Diagnosis not present

## 2020-12-31 DIAGNOSIS — R0789 Other chest pain: Secondary | ICD-10-CM | POA: Diagnosis not present

## 2021-01-01 ENCOUNTER — Emergency Department (HOSPITAL_COMMUNITY): Payer: Medicaid Other

## 2021-01-01 ENCOUNTER — Other Ambulatory Visit: Payer: Self-pay

## 2021-01-01 ENCOUNTER — Emergency Department (HOSPITAL_COMMUNITY)
Admission: EM | Admit: 2021-01-01 | Discharge: 2021-01-01 | Disposition: A | Discharge status: Home / Self Care | Payer: Medicaid Other | Attending: Emergency Medicine | Admitting: Emergency Medicine

## 2021-01-01 ENCOUNTER — Encounter (HOSPITAL_COMMUNITY): Payer: Self-pay | Admitting: Emergency Medicine

## 2021-01-01 DIAGNOSIS — R519 Headache, unspecified: Secondary | ICD-10-CM | POA: Diagnosis not present

## 2021-01-01 DIAGNOSIS — R739 Hyperglycemia, unspecified: Secondary | ICD-10-CM

## 2021-01-01 DIAGNOSIS — R0602 Shortness of breath: Secondary | ICD-10-CM | POA: Diagnosis not present

## 2021-01-01 DIAGNOSIS — R079 Chest pain, unspecified: Secondary | ICD-10-CM

## 2021-01-01 DIAGNOSIS — R0789 Other chest pain: Secondary | ICD-10-CM | POA: Diagnosis not present

## 2021-01-01 DIAGNOSIS — E1165 Type 2 diabetes mellitus with hyperglycemia: Secondary | ICD-10-CM | POA: Diagnosis not present

## 2021-01-01 LAB — CBC
HCT: 39.6 % (ref 36.0–46.0)
Hemoglobin: 13 g/dL (ref 12.0–15.0)
MCH: 28.6 pg (ref 26.0–34.0)
MCHC: 32.8 g/dL (ref 30.0–36.0)
MCV: 87 fL (ref 80.0–100.0)
Platelets: 226 10*3/uL (ref 150–400)
RBC: 4.55 MIL/uL (ref 3.87–5.11)
RDW: 13.4 % (ref 11.5–15.5)
WBC: 13.1 10*3/uL — ABNORMAL HIGH (ref 4.0–10.5)
nRBC: 0 % (ref 0.0–0.2)

## 2021-01-01 LAB — BASIC METABOLIC PANEL
Anion gap: 8 (ref 5–15)
BUN: 13 mg/dL (ref 6–20)
CO2: 23 mmol/L (ref 22–32)
Calcium: 9.1 mg/dL (ref 8.9–10.3)
Chloride: 102 mmol/L (ref 98–111)
Creatinine, Ser: 0.8 mg/dL (ref 0.44–1.00)
GFR, Estimated: >60 mL/min (ref >60)
Glucose, Bld: 243 mg/dL — ABNORMAL HIGH (ref 70–99)
Potassium: 3.8 mmol/L (ref 3.5–5.1)
Sodium: 133 mmol/L — ABNORMAL LOW (ref 135–145)

## 2021-01-01 LAB — TROPONIN I (HIGH SENSITIVITY): Troponin I (High Sensitivity): <2 ng/L (ref <18)

## 2021-01-01 LAB — BRAIN NATRIURETIC PEPTIDE: B Natriuretic Peptide: 25 pg/mL (ref 0.0–100.0)

## 2021-01-01 LAB — D-DIMER, QUANTITATIVE: D-Dimer, Quant: 0.35 ug/mL-FEU (ref 0.00–0.50)

## 2021-01-01 MED ORDER — IOHEXOL 350 MG/ML SOLN
100.0000 mL | Freq: Once | INTRAVENOUS | Status: AC | PRN
  Administered 2021-01-01: 100 mL via INTRAVENOUS

## 2021-01-01 MED ORDER — KETOROLAC TROMETHAMINE 30 MG/ML IJ SOLN
30.0000 mg | Freq: Once | INTRAMUSCULAR | Status: AC
  Administered 2021-01-01: 30 mg via INTRAVENOUS
  Filled 2021-01-01: qty 1

## 2021-01-01 MED ORDER — METFORMIN HCL 500 MG PO TABS: 500.0000 mg | ORAL_TABLET | Freq: Two times a day (BID) | ORAL | 4 refills | Status: AC

## 2021-01-01 MED ORDER — ALBUTEROL SULFATE HFA 108 (90 BASE) MCG/ACT IN AERS
4.0000 | INHALATION_SPRAY | Freq: Once | RESPIRATORY_TRACT | Status: AC
  Administered 2021-01-01: 4 via RESPIRATORY_TRACT
  Filled 2021-01-01: qty 6.7

## 2021-01-01 NOTE — ED Notes (Signed)
Patient transported to CT 

## 2021-01-01 NOTE — Telephone Encounter (Signed)
I checked the status on the portal and it is still pending.  

## 2021-01-01 NOTE — ED Triage Notes (Signed)
Pt c/o chest pain, headache, and SOB x 2 weeks.

## 2021-01-01 NOTE — ED Provider Notes (Signed)
[facility] EMERGENCY DEPARTMENT Provider Note   CSN: [id number] Arrival date & time: 12/31/20  2321     History Chief Complaint  Patient presents with  . Chest Pain    Melissa Ramsey is a 45 y.o. female.   Chest Pain Pain location:  Substernal area and L chest Pain quality: aching and pressure   Pain radiates to:  Does not radiate Pain severity:  Mild Duration:  2 weeks Timing:  Constant Progression:  Unchanged Chronicity:  New Context: breathing   Context: not raising an arm, not stress and not trauma   Relieved by:  None tried Worsened by:  Nothing Ineffective treatments:  None tried Associated symptoms: no abdominal pain, no altered mental status, no anorexia, no dizziness, no headache and no numbness        Past Medical History:  Diagnosis Date  . BP (high blood pressure) 09/24/2020  . History of pre-eclampsia in prior pregnancy, currently pregnant in first trimester 05/30/2019  . Rh negative state in antepartum period     Patient Active Problem List   Diagnosis Date Noted  . SOB (shortness of breath) 12/24/2020  . Dizziness 12/24/2020  . Nonintractable headache 12/24/2020  . New onset headache 12/24/2020  . Encounter to establish care 09/24/2020  . Benign essential tremor 09/24/2020  . Seizure-like activity (HCC) 09/24/2020  . Obesity (BMI 30.0-34.9) 09/24/2020  . BP (high blood pressure) 09/24/2020  . 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 Tobacco abuse 05/30/2019    Past Surgical History:  Procedure Laterality Date  . CESAREAN SECTION    . CHOLECYSTECTOMY       OB History    Gravida  8   Para  5   Term  3   Preterm  2   AB  2   Living  5     SAB  1   IAB      Ectopic      Multiple      Live Births  5           Family History  Problem Relation Age of Onset  . Hepatitis C Father   . Cirrhosis Father   . Congestive Heart Failure Mother   . Down syndrome Brother   . Aneurysm Sister        brain  . Diabetes Sister   .  Hypertension Sister   . High Cholesterol Sister   . Heart attack Sister   . Hypertension Sister   . High Cholesterol Sister   . Stroke Sister   . Hypertension Sister   . High Cholesterol Sister     Social History   Tobacco Use  . Smoking status: Former Smoker    Packs/day: 0.50    Types: Cigarettes  . Smokeless tobacco: Never Used  Vaping Use  . Vaping Use: Never used  Substance Use Topics  . Alcohol use: Not Currently  . Drug use: Not Currently    Home Medications Prior to Admission medications   Medication Sig Start Date End Date Taking? Authorizing Provider  metFORMIN (GLUCOPHAGE) 500 MG tablet Take 1 tablet (500 mg total) by mouth 2 (two) times daily with a meal. 01/01/21  Yes Brittnae Aschenbrenner, Barbara Cower, MD  metoprolol succinate (TOPROL XL) 25 MG 24 hr tablet Take 1 tablet (25 mg total) by mouth daily. 12/24/20   Levert Feinstein, MD    Allergies    Patient has no known allergies.  Review of Systems   Review of Systems  Cardiovascular: Positive for chest pain.  Gastrointestinal: Negative for abdominal pain and anorexia.  Neurological: Negative for dizziness, numbness and headaches.  All other systems reviewed and are negative.   Physical Exam Updated Vital Signs BP 104/74   Pulse 78   Temp 98.1 F (36.7 C)   Resp (!) 21   Ht 5' (1.524 m)   Wt 79.8 kg   SpO2 98%   BMI 34.37 kg/m   Physical Exam Vitals and nursing note reviewed.  Constitutional:      Appearance: She is well-developed and well-nourished.  HENT:     Head: Normocephalic and atraumatic.     Nose: Nose normal. No congestion or rhinorrhea.     Mouth/Throat:     Mouth: Mucous membranes are moist.  Eyes:     Pupils: Pupils are equal, round, and reactive to light.  Cardiovascular:     Rate and Rhythm: Normal rate and regular rhythm.  Pulmonary:     Effort: No respiratory distress.     Breath sounds: No stridor.  Abdominal:     General: There is no distension.     Palpations: Abdomen is soft.   Musculoskeletal:        General: No swelling or tenderness. Normal range of motion.     Cervical back: Normal range of motion.  Skin:    General: Skin is warm and dry.     Coloration: Skin is not jaundiced or pale.  Neurological:     General: No focal deficit present.     Mental Status: She is alert.     ED Results / Procedures / Treatments   Labs (all labs ordered are listed, but only abnormal results are displayed) Labs Reviewed  BASIC METABOLIC PANEL - Abnormal; Notable for the following components:      Result Value   Sodium 133 (*)    Glucose, Bld 243 (*)    All other components within normal limits  CBC - Abnormal; Notable for the following components:   WBC 13.1 (*)    All other components within normal limits  BRAIN NATRIURETIC PEPTIDE  D-DIMER, QUANTITATIVE (NOT AT [facility])  POC URINE PREG, ED  TROPONIN I (HIGH SENSITIVITY)    EKG EKG Interpretation  Date/Time:  Wednesday January 01 2021 00:42:57 EST Ventricular Rate:  98 PR Interval:  152 QRS Duration: 76 QT Interval:  356 QTC Calculation: 454 R Axis:   48 Text Interpretation: Normal sinus rhythm Normal ECG Confirmed by Marily Memos [phone]) on 01/01/2021 3:11:54 AM   Radiology DG Chest 2 View  Result Date: 01/01/2021 CLINICAL DATA:  Headache and shortness of breath EXAM: CHEST - 2 VIEW COMPARISON:  09/08/2020 FINDINGS: There is a retrocardiac airspace opacity, new since prior study. This is best appreciated on the frontal view. There is no pneumothorax or large pleural effusion. The heart size is stable. There is no acute osseous abnormality. IMPRESSION: New retrocardiac airspace opacity, best appreciated on the frontal view. This may represent atelectasis or developing infiltrate. Follow-up to radiologic resolution is recommended. Electronically Signed   By: Katherine Mantle M.D.   On: 01/01/2021 01:36   CT Angio Chest PE W and/or Wo Contrast  Result Date: 01/01/2021 CLINICAL DATA:  PE suspected.   Shortness of breath. EXAM: CT ANGIOGRAPHY CHEST WITH CONTRAST TECHNIQUE: Multidetector CT imaging of the chest was performed using the standard protocol during bolus administration of intravenous contrast. Multiplanar CT image reconstructions and MIPs were obtained to evaluate the vascular anatomy. CONTRAST:  OMNIPAQUE IOHEXOL 350 MG/ML SOLN COMPARISON:  None.  FINDINGS: Cardiovascular: Contrast injection is sufficient to demonstrate satisfactory opacification of the pulmonary arteries to the segmental level. There is no pulmonary embolus or evidence of right heart strain. The size of the main pulmonary artery is normal. Heart size is normal, with no pericardial effusion. The course and caliber of the aorta are normal. There is no atherosclerotic calcification. Opacification decreased due to pulmonary arterial phase contrast bolus timing. Mediastinum/Nodes: -- No mediastinal lymphadenopathy. -- No hilar lymphadenopathy. -- No axillary lymphadenopathy. -- No supraclavicular lymphadenopathy. -- Normal thyroid gland where visualized. -  Unremarkable esophagus. Lungs/Pleura: Airways are patent. No pleural effusion, lobar consolidation, pneumothorax or pulmonary infarction. Upper Abdomen: Contrast bolus timing is not optimized for evaluation of the abdominal organs. There is hepatic steatosis. Musculoskeletal: No chest wall abnormality. No bony spinal canal stenosis. Review of the MIP images confirms the above findings. IMPRESSION: 1. No evidence of pulmonary embolism or other acute intrathoracic process. 2. Hepatic steatosis. Electronically Signed   By: Katherine Mantle M.D.   On: 01/01/2021 06:34    Procedures Procedures (including critical care time)  Medications Ordered in ED Medications  ketorolac (TORADOL) 30 MG/ML injection 30 mg (30 mg Intravenous Given 01/01/21 0417)  albuterol (VENTOLIN HFA) 108 (90 Base) MCG/ACT inhaler 4 puff (4 puffs Inhalation Given 01/01/21 0418)  iohexol (OMNIPAQUE) 350  MG/ML injection 100 mL (100 mLs Intravenous Contrast Given 01/01/21 6734)    ED Course  I have reviewed the triage vital signs and the nursing notes.  Pertinent labs & imaging results that were available during my care of the patient were reviewed by me and considered in my medical decision making (see chart for details).    MDM Rules/Calculators/A&P                          Hyperglycemic but not likely related to his symptoms today.  We will start metformin.  We will follow-up with her PCP for the same.  She has an echocardiogram ordered.  Her work-up here otherwise is negative.  No evidence of PE.  Low suspicion for ACS.  No evidence of renal failure or heart failure.  Very well could be anxiety as she has multiple stressors in her life and states that symptoms do seem to be worse when she is stressed out by her kids.  Will defer to PCP for further management work-up of the same.  Final Clinical Impression(s) / ED Diagnoses Final diagnoses:  Nonspecific chest pain  Hyperglycemia    Rx / DC Orders ED Discharge Orders         Ordered    metFORMIN (GLUCOPHAGE) 500 MG tablet  2 times daily with meals        01/01/21 0657           Siah Kannan, Barbara Cower, MD 01/01/21 [phone]

## 2021-01-02 ENCOUNTER — Telehealth: Payer: Self-pay

## 2021-01-02 NOTE — Telephone Encounter (Signed)
Transition Care Management Follow-up Telephone Call  Date of discharge and from where: 01/01/2021 [facility]  How have you been since you were released from the hospital? Still feeling about the same, has follow up with primary and cardiology next week. Hoping to get some additional relief.   Any questions or concerns? No  Items Reviewed:  Did the pt receive and understand the discharge instructions provided? Yes   Medications obtained and verified? Yes   Other? No   Any new allergies since your discharge? No   Dietary orders reviewed? Yes  Do you have support at home? Yes     Functional Questionnaire: (I = Independent and D = Dependent) ADLs: I  Bathing/Dressing- I  Meal Prep- I  Eating- I  Maintaining continence- I  Transferring/Ambulation- I  Managing Meds- I  Follow up appointments reviewed:   PCP Hospital f/u appt confirmed? Yes  Scheduled to see Dr. Allena Katz on 01/08/2021  @ 1:40pm.  Specialist Hospital f/u appt confirmed? Yes  Scheduled to see Cardiology on 01/06/2021 @ 1:20pm.  Are transportation arrangements needed? No   If their condition worsens, is the pt aware to call PCP or go to the Emergency Dept.? Yes  Was the patient provided with contact information for the PCP's office or ED? Yes  Was to pt encouraged to call back with questions or concerns? Yes

## 2021-01-06 ENCOUNTER — Other Ambulatory Visit: Payer: Self-pay

## 2021-01-06 ENCOUNTER — Ambulatory Visit (INDEPENDENT_AMBULATORY_CARE_PROVIDER_SITE_OTHER): Payer: Medicaid Other | Admitting: Cardiology

## 2021-01-06 ENCOUNTER — Encounter: Payer: Self-pay | Admitting: Cardiology

## 2021-01-06 ENCOUNTER — Encounter: Payer: Self-pay | Admitting: *Deleted

## 2021-01-06 VITALS — BP 116/90 | HR 89 | Ht 60.0 in | Wt 172.0 lb

## 2021-01-06 DIAGNOSIS — R0789 Other chest pain: Secondary | ICD-10-CM | POA: Diagnosis not present

## 2021-01-06 DIAGNOSIS — F17201 Nicotine dependence, unspecified, in remission: Secondary | ICD-10-CM | POA: Diagnosis not present

## 2021-01-06 DIAGNOSIS — R0600 Dyspnea, unspecified: Secondary | ICD-10-CM

## 2021-01-06 DIAGNOSIS — R0609 Other forms of dyspnea: Secondary | ICD-10-CM

## 2021-01-06 NOTE — Patient Instructions (Addendum)
Medication Instructions:  Your physician recommends that you continue on your current medications as directed. Please refer to the Current Medication list given to you today.  Labwork: none  Testing/Procedures: Your physician has requested that you have an echocardiogram. Echocardiography is a painless test that uses sound waves to create images of your heart. It provides your doctor with information about the size and shape of your heart and how well your heart's chambers and valves are working. This procedure takes approximately one hour. There are no restrictions for this procedure. Your physician has requested that you have a lexiscan myoview. For further information please visit [email or web address]. Please follow instruction sheet, as given.  Follow-Up: Your physician recommends that you schedule a follow-up appointment in: pending  Any Other Special Instructions Will Be Listed Below (If Applicable).  If you need a refill on your cardiac medications before your next appointment, please call your pharmacy. 

## 2021-01-06 NOTE — Telephone Encounter (Signed)
[facility] Harvey: [id number] (exp. 12/25/20 to 01/24/21) order sent to GI . They will reach out to the patient to schedule.

## 2021-01-06 NOTE — Progress Notes (Signed)
Cardiology Office Note  Date: 01/06/2021   ID: Melissa Ramsey, DOB 1976-09-19, MRN [id number]  PCP:  Melissa Halon, MD  Cardiologist:  Melissa Dell, MD Electrophysiologist:  None   Chief Complaint  Patient presents with  . Chest Pain    History of Present Illness: Melissa Ramsey is a 45 y.o. female referred for cardiology consultation by Dr. Clayborne Dana after recent ER visit at [facility] with chest pain, I reviewed the records.  We discussed her symptoms today.  Actually, she describes more a sense of dyspnea on exertion and fatigue, worse over the last week or so.  She does feel a squeezing sensation in her chest when she is short of breath, sometimes wakes up at nighttime feeling short of breath as well.  She sleeps on 4 pillows chronically.  She states that she had COVID-19 in September 2021, quit smoking in October 2021.  She has recently had upward trend in blood pressure and heart rate, started on beta-blocker, also hyperglycemia, started on Glucophage.  I reviewed her recent lab work and ECG, also chest CTA results as noted below.  She has not undergone any prior cardiac structural or ischemic testing.  Past Medical History:  Diagnosis Date  . BP (high blood pressure) 09/24/2020  . History of pre-eclampsia in prior pregnancy, currently pregnant in first trimester 05/30/2019  . Rh negative state in antepartum period     Past Surgical History:  Procedure Laterality Date  . CESAREAN SECTION    . CHOLECYSTECTOMY      Current Outpatient Medications  Medication Sig Dispense Refill  . metFORMIN (GLUCOPHAGE) 500 MG tablet Take 1 tablet (500 mg total) by mouth 2 (two) times daily with a meal. 60 tablet 4  . metoprolol succinate (TOPROL XL) 25 MG 24 hr tablet Take 1 tablet (25 mg total) by mouth daily. 30 tablet 3   No current facility-administered medications for this visit.   Allergies:  Patient has no known allergies.   Social History: The patient  reports that she  has quit smoking. Her smoking use included cigarettes. She smoked 0.50 packs per day. She has never used smokeless tobacco. She reports previous alcohol use. She reports previous drug use.   Family History: The patient's family history includes Aneurysm in her sister; Cirrhosis in her father; Congestive Heart Failure in her mother; Diabetes in her sister; Down syndrome in her brother; Heart attack in her sister; Hepatitis C in her father; High Cholesterol in her sister, sister, and sister; Hypertension in her sister, sister, and sister; Stroke in her sister.   ROS: No palpitations or syncope.  Physical Exam: VS:  BP 116/90   Pulse 89   Ht 5' (1.524 m)   Wt 172 lb (78 kg)   SpO2 97%   BMI 33.59 kg/m , BMI Body mass index is 33.59 kg/m.  Wt Readings from Last 3 Encounters:  01/06/21 172 lb (78 kg)  01/01/21 176 lb (79.8 kg)  12/24/20 176 lb (79.8 kg)    General: Patient appears comfortable at rest. HEENT: Conjunctiva and lids normal, wearing a mask. Neck: Supple, no elevated JVP or carotid bruits, no thyromegaly. Lungs: Clear to auscultation, nonlabored breathing at rest. Cardiac: Regular rate and rhythm, no S3 or significant systolic murmur, no pericardial rub. Abdomen: Soft, nontender, bowel sounds present. Extremities: No pitting edema, distal pulses 2+. Skin: Warm and dry. Musculoskeletal: No kyphosis. Neuropsychiatric: Alert and oriented x3, affect grossly appropriate.  ECG:  An ECG dated 01/01/2021 was personally  reviewed today and demonstrated:  Normal sinus rhythm.  Lead motion artifact.  Recent Labwork: 09/25/2020: ALT 39; AST 35; TSH 2.450 01/01/2021: B Natriuretic Peptide 25.0; BUN 13; Creatinine, Ser 0.80; Hemoglobin 13.0; Platelets 226; Potassium 3.8; Sodium 133     Component Value Date/Time   CHOL 199 09/25/2020 0921   TRIG 419 (H) 09/25/2020 0921   HDL 30 (L) 09/25/2020 0921   CHOLHDL 6.6 (H) 09/25/2020 0921   LDLCALC 98 09/25/2020 0921    Other Studies  Reviewed Today:  Chest CTA 01/01/2021: FINDINGS: Cardiovascular: Contrast injection is sufficient to demonstrate satisfactory opacification of the pulmonary arteries to the segmental level. There is no pulmonary embolus or evidence of right heart strain. The size of the main pulmonary artery is normal. Heart size is normal, with no pericardial effusion. The course and caliber of the aorta are normal. There is no atherosclerotic calcification. Opacification decreased due to pulmonary arterial phase contrast bolus timing.  Mediastinum/Nodes:  -- No mediastinal lymphadenopathy.  -- No hilar lymphadenopathy.  -- No axillary lymphadenopathy.  -- No supraclavicular lymphadenopathy.  -- Normal thyroid gland where visualized.  -  Unremarkable esophagus.  Lungs/Pleura: Airways are patent. No pleural effusion, lobar consolidation, pneumothorax or pulmonary infarction.  Upper Abdomen: Contrast bolus timing is not optimized for evaluation of the abdominal organs. There is hepatic steatosis.  Musculoskeletal: No chest wall abnormality. No bony spinal canal stenosis.  Review of the MIP images confirms the above findings.  IMPRESSION: 1. No evidence of pulmonary embolism or other acute intrathoracic process. 2. Hepatic steatosis.  Assessment and Plan:  1.  Dyspnea on exertion and intermittent chest discomfort as discussed above.  Patient is 45 years old, recently started on beta-blocker and Glucophage with elevated blood pressure and hyperglycemia.  PCP is Dr. Allena Katz.  Recent ECG normal as well as high-sensitivity troponin I and D-dimer.  Chest CTA showed no pulmonary embolus and did not describe any atherosclerotic changes. She reports having had COVID-19 in September of last year.  She has not undergone any formal cardiac structural or ischemic testing.  Plan will be to pursue echocardiogram and Lexiscan Myoview.  2.  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  Medication Adjustments/Labs and Tests Ordered: Current medicines are reviewed at length with the patient today.  Concerns regarding medicines are outlined above.   Tests Ordered: Orders Placed This Encounter  Procedures  . NM Myocar Multi W/Spect W/Wall Motion / EF  . ECHOCARDIOGRAM COMPLETE    Medication Changes: No orders of the defined types were placed in this encounter.   Disposition:  Follow up test results.  Signed, Jonelle Sidle, MD, [facility] 01/06/2021 1:49 PM    [facility] [address] [place], [place], [place] [address] Phone: [phone]; Fax: [phone]

## 2021-01-08 ENCOUNTER — Ambulatory Visit: Payer: Medicaid Other | Admitting: Internal Medicine

## 2021-01-09 ENCOUNTER — Ambulatory Visit (HOSPITAL_COMMUNITY): Admission: RE | Admit: 2021-01-09 | Payer: Medicaid Other | Source: Ambulatory Visit

## 2021-01-09 ENCOUNTER — Encounter (HOSPITAL_COMMUNITY): Payer: Medicaid Other

## 2021-01-14 DIAGNOSIS — F4323 Adjustment disorder with mixed anxiety and depressed mood: Secondary | ICD-10-CM | POA: Diagnosis not present

## 2021-01-15 ENCOUNTER — Telehealth: Payer: Self-pay | Admitting: Family Medicine

## 2021-01-15 ENCOUNTER — Ambulatory Visit (HOSPITAL_COMMUNITY): Admission: RE | Admit: 2021-01-15 | Payer: Medicaid Other | Source: Ambulatory Visit

## 2021-01-15 ENCOUNTER — Telehealth: Payer: Self-pay | Admitting: *Deleted

## 2021-01-15 ENCOUNTER — Encounter: Payer: Self-pay | Admitting: *Deleted

## 2021-01-15 DIAGNOSIS — R0600 Dyspnea, unspecified: Secondary | ICD-10-CM

## 2021-01-15 DIAGNOSIS — R0789 Other chest pain: Secondary | ICD-10-CM

## 2021-01-15 DIAGNOSIS — R0609 Other forms of dyspnea: Secondary | ICD-10-CM

## 2021-01-15 NOTE — Telephone Encounter (Signed)
Pre-cert Verification for the following procedure    GXT  DATE: 01/27/2021  LOCATION: [facility]  

## 2021-01-15 NOTE — Telephone Encounter (Signed)
Patient informed and verbalized understanding of plan. GXT instructions reviewed and released in mychart for review. Aware that she will need a covid test 2-3 days prior to GXT.

## 2021-01-15 NOTE — Telephone Encounter (Signed)
-----   Message from Jonelle Sidle, MD sent at 01/15/2021 12:33 PM EST ----- Regarding: FW: AUTH DENIAL Please let patient know that insurance company has denied the stress test we ordered originally.  Please schedule a GXT instead.  If this is approved, she would need to hold her beta-blocker for the test. ----- Message ----- From: Francine Graven Sent: 01/15/2021  12:01 PM EST To: Jonelle Sidle, MD Subject: RE: Ollen Bowl                                When the GXT is ordered, I will submit an auth and follow up.  ----- Message ----- From: Jonelle Sidle, MD Sent: 01/15/2021  11:57 AM EST To: Claudean Severance Abdul-Razzaaq Subject: RE: Ollen Bowl                                That is unfortunate.  We were investigating both cardiac structure and function as well as her chest pain symptoms.  We will certainly follow-up on the echocardiogram.  I would see if they would approve a GXT instead of the [facility], hopefully she will be able to walk on the treadmill, we would also have to hold her beta-blocker for that test.  If approved you will need to let nursing know so that we can make these adjustments. ----- Message ----- From: Francine Graven Sent: 01/15/2021  11:50 AM EST To: Eustace Moore, RN, Jonelle Sidle, MD Subject: Ollen Bowl                                    Good Morning,   The nuc auth for the patient was denied twice. [facility] did not find the necessity of it being ordered.  I informed them that pt is having an echo done today and they said pt shouldn't need nuc after echo.   Thank you,  Anisah

## 2021-01-24 ENCOUNTER — Other Ambulatory Visit: Payer: Self-pay

## 2021-01-24 ENCOUNTER — Other Ambulatory Visit (HOSPITAL_COMMUNITY)
Admission: RE | Admit: 2021-01-24 | Discharge: 2021-01-24 | Disposition: A | Discharge status: Home / Self Care | Payer: Medicaid Other | Source: Ambulatory Visit | Attending: Cardiology | Admitting: Cardiology

## 2021-01-24 NOTE — Progress Notes (Signed)
Called patient and left a message with her voicemail. Asking her to please call if she can get here before 4:00 or not. Called patient back to ask her to call her Dr.'s office to reschedule her procedure.(Left message with voicemail again.) She didn't get her covid test today and her procedure is Monday.

## 2021-01-27 ENCOUNTER — Encounter: Payer: Self-pay | Admitting: Emergency Medicine

## 2021-01-27 ENCOUNTER — Ambulatory Visit (HOSPITAL_COMMUNITY): Payer: Medicaid Other | Attending: Cardiology

## 2021-01-27 ENCOUNTER — Other Ambulatory Visit: Payer: Self-pay

## 2021-01-27 ENCOUNTER — Ambulatory Visit
Admission: EM | Admit: 2021-01-27 | Discharge: 2021-01-27 | Disposition: A | Discharge status: Home / Self Care | Payer: Medicaid Other | Attending: Family Medicine | Admitting: Family Medicine

## 2021-01-27 DIAGNOSIS — R059 Cough, unspecified: Secondary | ICD-10-CM | POA: Diagnosis not present

## 2021-01-27 DIAGNOSIS — J069 Acute upper respiratory infection, unspecified: Secondary | ICD-10-CM

## 2021-01-27 DIAGNOSIS — F4323 Adjustment disorder with mixed anxiety and depressed mood: Secondary | ICD-10-CM | POA: Diagnosis not present

## 2021-01-27 MED ORDER — BENZONATATE 100 MG PO CAPS: 200.0000 mg | ORAL_CAPSULE | Freq: Three times a day (TID) | ORAL | 0 refills | Status: AC | PRN

## 2021-01-27 MED ORDER — LIDOCAINE VISCOUS HCL 2 % MT SOLN: 15.0000 mL | OROMUCOSAL | 0 refills | Status: AC | PRN

## 2021-01-27 MED ORDER — PREDNISONE 20 MG PO TABS: 40.0000 mg | ORAL_TABLET | Freq: Every day | ORAL | 0 refills | Status: AC

## 2021-01-27 NOTE — ED Triage Notes (Signed)
Cough x 2days.  rt ear pain and sore throat on right side of throat that started last night.

## 2021-01-27 NOTE — Discharge Instructions (Addendum)
Your COVID 19 results should result within 3-5 days. Negative results are immediately resulted to Mychart. Positive results will receive a follow-up call from our clinic. If symptoms are present, I recommend home quarantine until results are known.  Prednisone prescribed to help with cough and throat inflammation For throat pain I have prescribed you lidocaine viscous he can mix with warm water gargle and spit as needed for throat pain Benzonatate 200 mg 3 times daily as needed for cough.  Alternate Tylenol and ibuprofen as needed for body aches and fever.  Symptom management per recommendations discussed today.  If any breathing difficulty or chest pain develops go immediately to the closest emergency department for evaluation.

## 2021-01-28 LAB — COVID-19, FLU A+B NAA
Influenza A, NAA: NOT DETECTED
Influenza B, NAA: NOT DETECTED
SARS-CoV-2, NAA: NOT DETECTED

## 2021-02-04 DIAGNOSIS — F4323 Adjustment disorder with mixed anxiety and depressed mood: Secondary | ICD-10-CM | POA: Diagnosis not present

## 2021-02-11 DIAGNOSIS — F4323 Adjustment disorder with mixed anxiety and depressed mood: Secondary | ICD-10-CM | POA: Diagnosis not present

## 2021-02-25 DIAGNOSIS — F4323 Adjustment disorder with mixed anxiety and depressed mood: Secondary | ICD-10-CM | POA: Diagnosis not present

## 2021-03-04 DIAGNOSIS — F4323 Adjustment disorder with mixed anxiety and depressed mood: Secondary | ICD-10-CM | POA: Diagnosis not present

## 2021-03-18 DIAGNOSIS — F4323 Adjustment disorder with mixed anxiety and depressed mood: Secondary | ICD-10-CM | POA: Diagnosis not present

## 2021-03-25 DIAGNOSIS — F4323 Adjustment disorder with mixed anxiety and depressed mood: Secondary | ICD-10-CM | POA: Diagnosis not present

## 2021-04-02 DIAGNOSIS — F4323 Adjustment disorder with mixed anxiety and depressed mood: Secondary | ICD-10-CM | POA: Diagnosis not present

## 2021-04-08 DIAGNOSIS — F4323 Adjustment disorder with mixed anxiety and depressed mood: Secondary | ICD-10-CM | POA: Diagnosis not present

## 2021-04-16 DIAGNOSIS — F4323 Adjustment disorder with mixed anxiety and depressed mood: Secondary | ICD-10-CM | POA: Diagnosis not present

## 2022-02-10 IMAGING — DX DG CHEST 1V PORT
1 series · 1 of 1 positions shown · non-contrast
Comparison: 04/10/2019

CLINICAL DATA: Cough and shortness of breath, history of COVID
exposure

EXAM:
PORTABLE CHEST 1 VIEW

[chest ap]
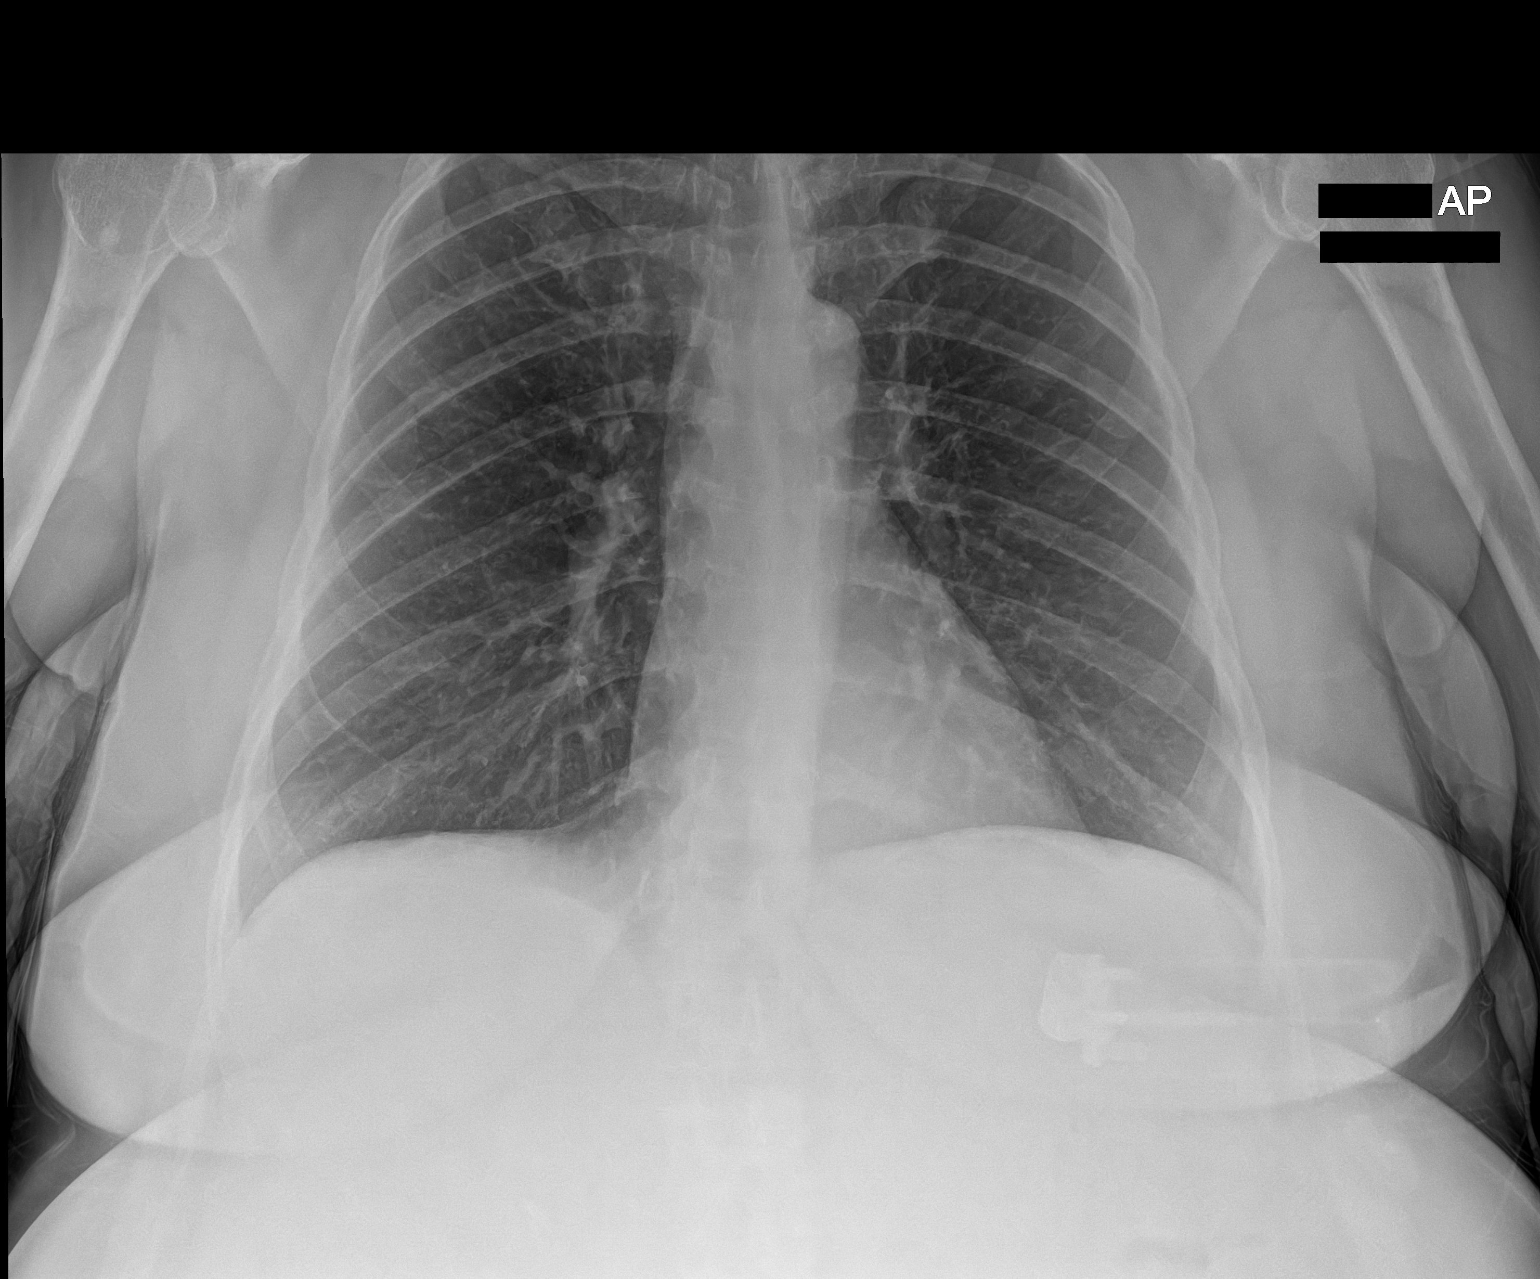

[1 of 1 positions shown; findings below may reference images not displayed]

FINDINGS: The heart size and mediastinal contours are within normal limits.
Both lungs are clear. The visualized skeletal structures are
unremarkable.
IMPRESSION: No active disease.

## 2022-03-01 IMAGING — MG DIGITAL SCREENING BILAT W/ CAD
4 series · 4 of 4 positions shown · non-contrast
Comparison: None.

CLINICAL DATA: Screening.

EXAM:
DIGITAL SCREENING BILATERAL MAMMOGRAM WITH CAD

[R CC]
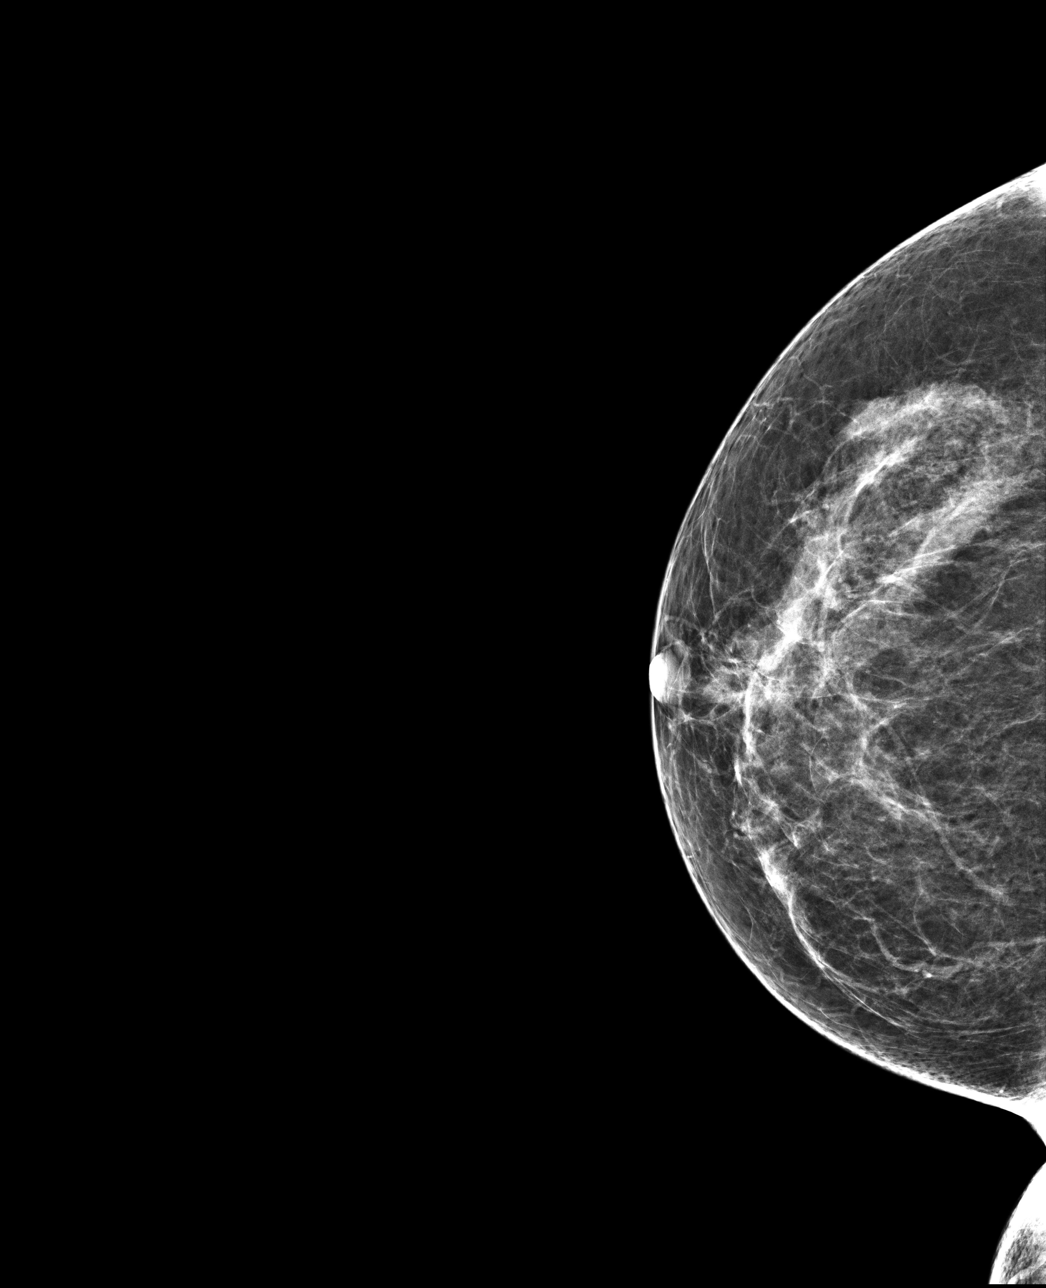

[R MLO]
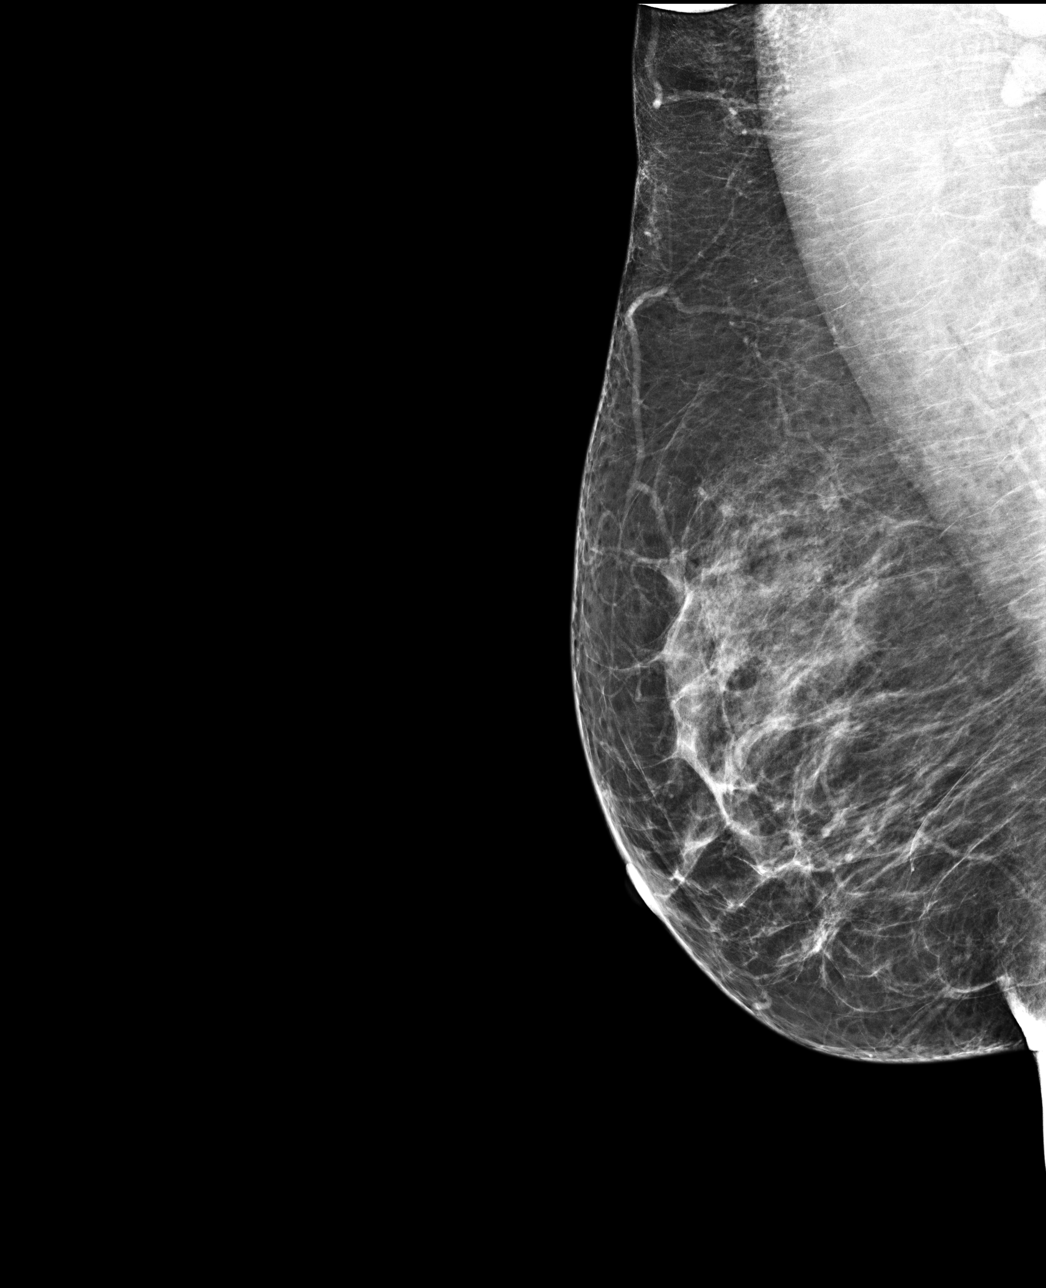

[L CC]
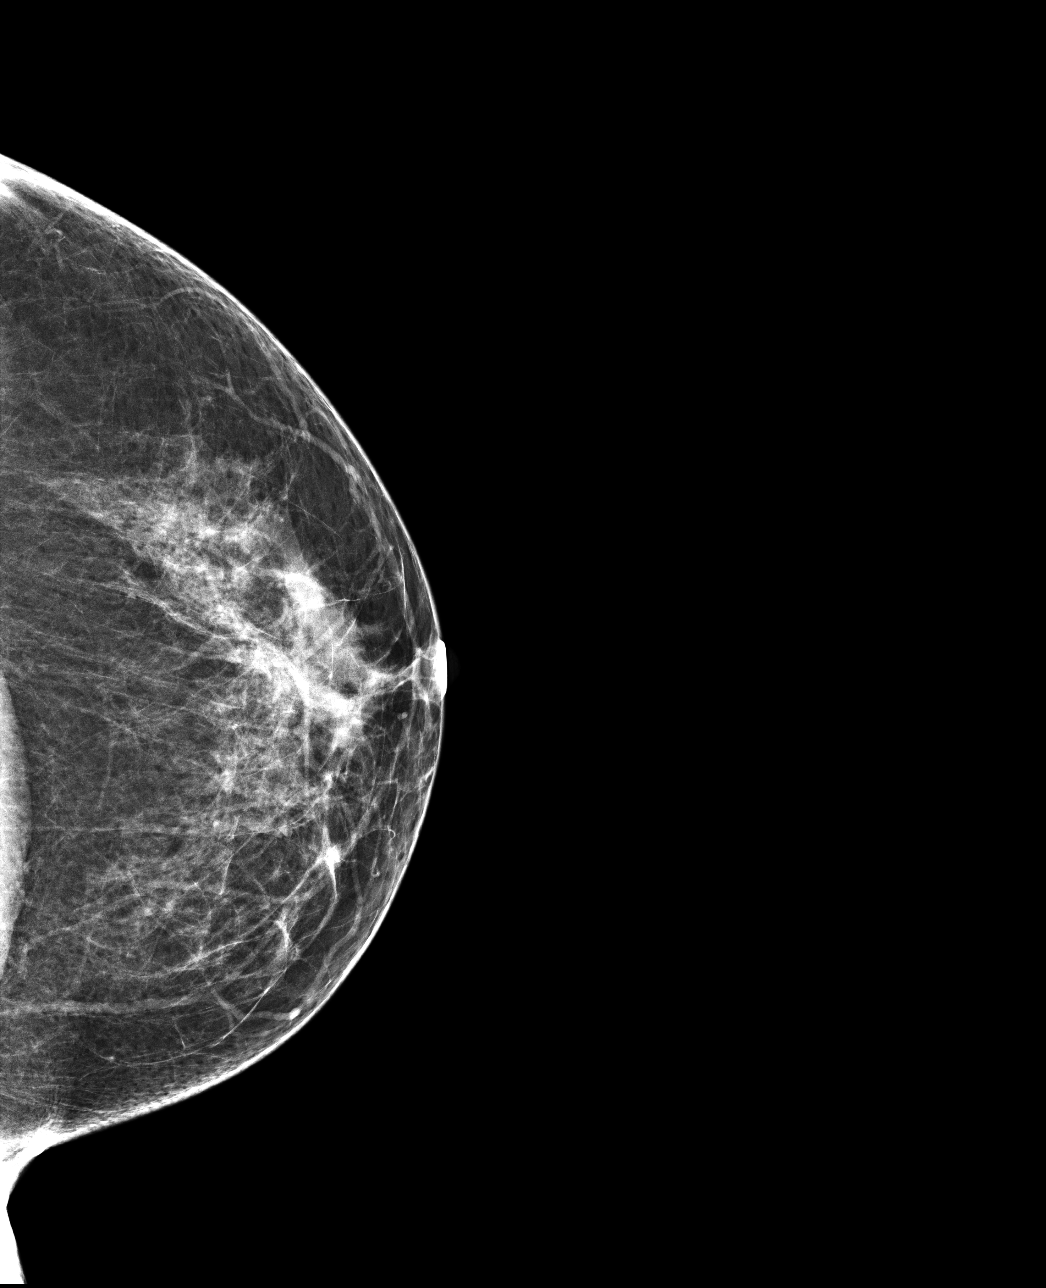

[L MLO]
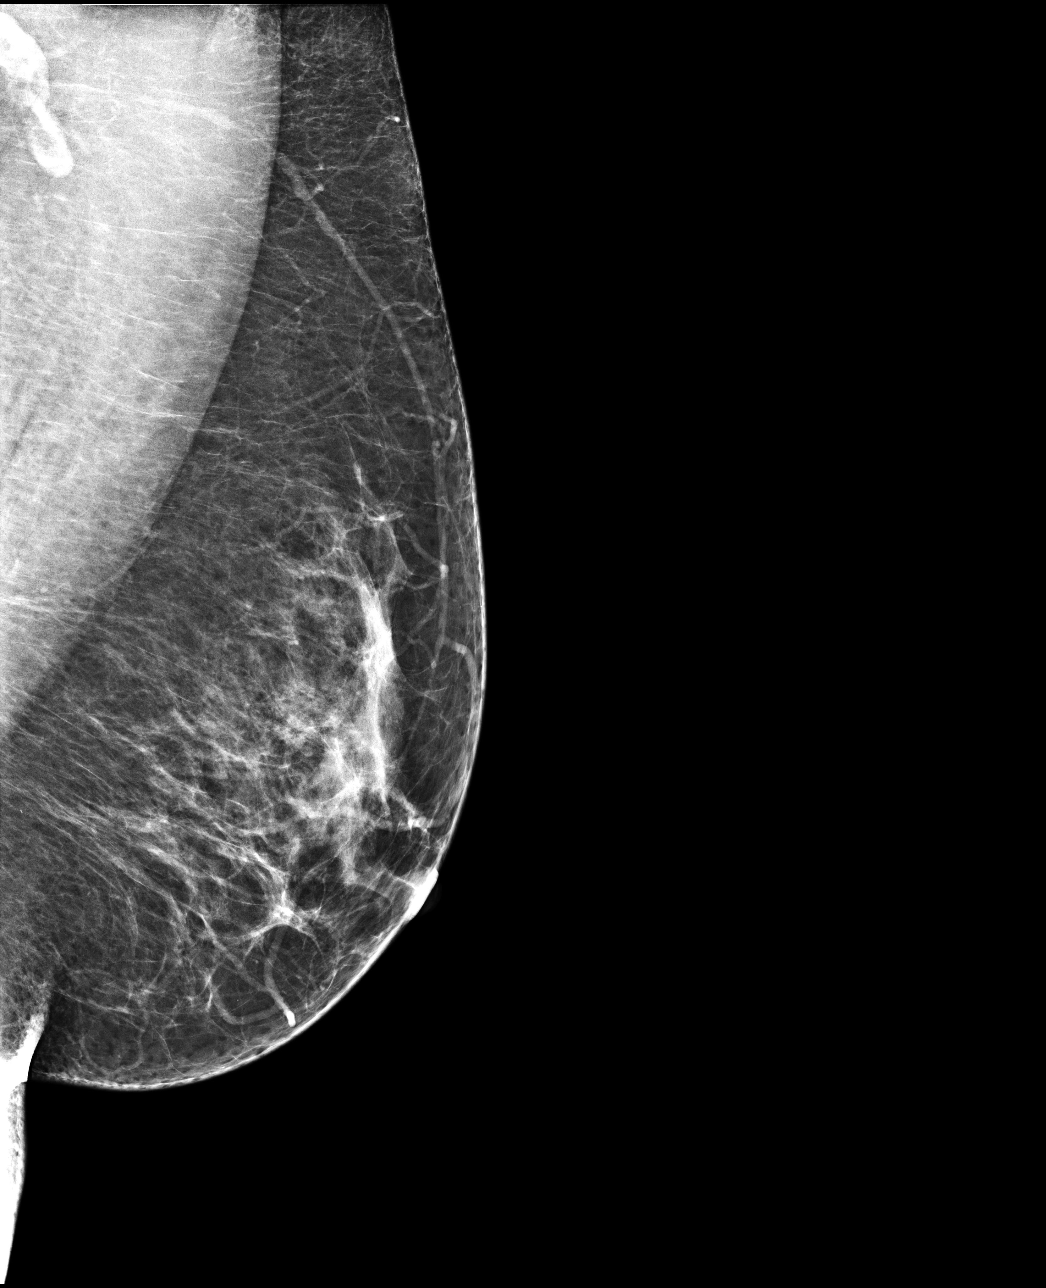

[4 of 4 positions shown; findings below may reference images not displayed]

ACR Breast Density Category c: The breast tissue is heterogeneously
dense, which may obscure small masses
FINDINGS: There are no findings suspicious for malignancy. Images were
processed with CAD.
IMPRESSION: No mammographic evidence of malignancy. A result letter of this
screening mammogram will be mailed directly to the patient.

RECOMMENDATION:
Screening mammogram in one year. (Code:[id number])

BI-RADS CATEGORY  1: Negative.
